# Patient Record
Sex: Female | Born: 1965
Health system: Southern US, Community
[De-identification: ages and names within clinical notes are randomized; demographics above are authoritative.]

## PROBLEM LIST (undated history)

## (undated) DIAGNOSIS — M25562 Pain in left knee: Secondary | ICD-10-CM

## (undated) DIAGNOSIS — G4730 Sleep apnea, unspecified: Secondary | ICD-10-CM

## (undated) DIAGNOSIS — T4145XA Adverse effect of unspecified anesthetic, initial encounter: Secondary | ICD-10-CM

## (undated) DIAGNOSIS — C801 Malignant (primary) neoplasm, unspecified: Secondary | ICD-10-CM

## (undated) DIAGNOSIS — F419 Anxiety disorder, unspecified: Secondary | ICD-10-CM

## (undated) DIAGNOSIS — G5603 Carpal tunnel syndrome, bilateral upper limbs: Secondary | ICD-10-CM

## (undated) HISTORY — PX: ABDOMINAL HYSTERECTOMY: SHX81

## (undated) HISTORY — PX: TONSILLECTOMY: SUR1361

---

## 1986-11-02 HISTORY — PX: TUBAL LIGATION: SHX77

## 1988-11-02 DIAGNOSIS — C801 Malignant (primary) neoplasm, unspecified: Secondary | ICD-10-CM

## 1988-11-02 HISTORY — DX: Malignant (primary) neoplasm, unspecified: C80.1

## 2007-11-03 HISTORY — PX: CHOLECYSTECTOMY: SHX55

## 2008-08-29 ENCOUNTER — Emergency Department (HOSPITAL_COMMUNITY): Admission: EM | Admit: 2008-08-29 | Discharge: 2008-08-29 | Payer: Self-pay | Admitting: Family Medicine

## 2008-11-02 HISTORY — PX: CERVICAL BIOPSY  W/ LOOP ELECTRODE EXCISION: SUR135

## 2008-11-02 HISTORY — PX: FOOT SURGERY: SHX648

## 2009-05-23 ENCOUNTER — Emergency Department (HOSPITAL_COMMUNITY): Admission: EM | Admit: 2009-05-23 | Discharge: 2009-05-23 | Payer: Self-pay | Admitting: Emergency Medicine

## 2009-09-19 ENCOUNTER — Emergency Department (HOSPITAL_COMMUNITY): Admission: EM | Admit: 2009-09-19 | Discharge: 2009-09-19 | Payer: Self-pay | Admitting: Emergency Medicine

## 2009-09-29 ENCOUNTER — Emergency Department (HOSPITAL_BASED_OUTPATIENT_CLINIC_OR_DEPARTMENT_OTHER): Admission: EM | Admit: 2009-09-29 | Discharge: 2009-09-30 | Payer: Self-pay | Admitting: Emergency Medicine

## 2009-09-30 ENCOUNTER — Ambulatory Visit: Payer: Self-pay | Admitting: Diagnostic Radiology

## 2010-02-03 ENCOUNTER — Emergency Department (HOSPITAL_COMMUNITY): Admission: EM | Admit: 2010-02-03 | Discharge: 2010-02-03 | Payer: Self-pay | Admitting: Emergency Medicine

## 2010-06-19 ENCOUNTER — Observation Stay (HOSPITAL_COMMUNITY): Admission: EM | Admit: 2010-06-19 | Discharge: 2010-06-22 | Payer: Self-pay | Admitting: Internal Medicine

## 2010-06-19 ENCOUNTER — Ambulatory Visit: Payer: Self-pay | Admitting: Cardiology

## 2010-06-19 ENCOUNTER — Ambulatory Visit: Payer: Self-pay | Admitting: Diagnostic Radiology

## 2010-06-19 ENCOUNTER — Encounter: Payer: Self-pay | Admitting: Emergency Medicine

## 2010-06-20 ENCOUNTER — Encounter (INDEPENDENT_AMBULATORY_CARE_PROVIDER_SITE_OTHER): Payer: Self-pay | Admitting: Internal Medicine

## 2010-06-20 HISTORY — PX: TRANSTHORACIC ECHOCARDIOGRAM: SHX275

## 2010-07-14 ENCOUNTER — Encounter: Admission: RE | Admit: 2010-07-14 | Discharge: 2010-07-14 | Payer: Self-pay | Admitting: Family Medicine

## 2010-07-23 ENCOUNTER — Other Ambulatory Visit
Admission: RE | Admit: 2010-07-23 | Discharge: 2010-07-23 | Payer: Self-pay | Source: Home / Self Care | Admitting: Obstetrics and Gynecology

## 2010-07-28 ENCOUNTER — Encounter: Admission: RE | Admit: 2010-07-28 | Discharge: 2010-07-28 | Payer: Self-pay | Admitting: Obstetrics and Gynecology

## 2010-08-20 ENCOUNTER — Encounter: Admission: RE | Admit: 2010-08-20 | Discharge: 2010-08-20 | Payer: Self-pay | Admitting: Family Medicine

## 2010-10-16 ENCOUNTER — Ambulatory Visit (HOSPITAL_COMMUNITY)
Admission: RE | Admit: 2010-10-16 | Discharge: 2010-10-17 | Payer: Self-pay | Source: Home / Self Care | Attending: Neurosurgery | Admitting: Neurosurgery

## 2010-10-16 HISTORY — PX: ANTERIOR CERVICAL DECOMP/DISCECTOMY FUSION: SHX1161

## 2010-11-07 ENCOUNTER — Encounter
Admission: RE | Admit: 2010-11-07 | Discharge: 2010-11-07 | Payer: Self-pay | Source: Home / Self Care | Attending: Neurosurgery | Admitting: Neurosurgery

## 2011-01-12 LAB — BASIC METABOLIC PANEL
BUN: 9 mg/dL (ref 6–23)
CO2: 30 mEq/L (ref 19–32)
Calcium: 9.3 mg/dL (ref 8.4–10.5)
Chloride: 105 mEq/L (ref 96–112)
Creatinine, Ser: 0.65 mg/dL (ref 0.4–1.2)
GFR calc Af Amer: >60 mL/min (ref >60)
GFR calc non Af Amer: >60 mL/min (ref >60)
Glucose, Bld: 104 mg/dL — ABNORMAL HIGH (ref 70–99)
Potassium: 4.1 mEq/L (ref 3.5–5.1)
Sodium: 140 mEq/L (ref 135–145)

## 2011-01-12 LAB — CK TOTAL AND CKMB (NOT AT ARMC)
CK, MB: 1 ng/mL (ref 0.3–4.0)
Relative Index: INVALID (ref 0.0–2.5)
Total CK: 53 U/L (ref 7–177)

## 2011-01-13 LAB — SURGICAL PCR SCREEN
MRSA, PCR: NEGATIVE
Staphylococcus aureus: NEGATIVE

## 2011-01-13 LAB — CBC
Hemoglobin: 12.5 g/dL (ref 12.0–15.0)
MCH: 30.3 pg (ref 26.0–34.0)
RBC: 4.13 MIL/uL (ref 3.87–5.11)

## 2011-01-16 LAB — CARDIAC PANEL(CRET KIN+CKTOT+MB+TROPI)
CK, MB: 1 ng/mL (ref 0.3–4.0)
CK, MB: 1.2 ng/mL (ref 0.3–4.0)
CK, MB: 1.2 ng/mL (ref 0.3–4.0)
Relative Index: INVALID (ref 0.0–2.5)
Total CK: 46 U/L (ref 7–177)
Troponin I: 0.01 ng/mL (ref 0.00–0.06)
Troponin I: 0.02 ng/mL (ref 0.00–0.06)
Troponin I: 0.04 ng/mL (ref 0.00–0.06)

## 2011-01-16 LAB — BASIC METABOLIC PANEL
BUN: 11 mg/dL (ref 6–23)
BUN: 12 mg/dL (ref 6–23)
BUN: 9 mg/dL (ref 6–23)
CO2: 29 mEq/L (ref 19–32)
Calcium: 9.3 mg/dL (ref 8.4–10.5)
Calcium: 9.3 mg/dL (ref 8.4–10.5)
Chloride: 107 mEq/L (ref 96–112)
Creatinine, Ser: 0.58 mg/dL (ref 0.4–1.2)
Creatinine, Ser: 0.6 mg/dL (ref 0.4–1.2)
Creatinine, Ser: 0.62 mg/dL (ref 0.4–1.2)
GFR calc Af Amer: >60 mL/min (ref >60)
GFR calc non Af Amer: >60 mL/min (ref >60)
GFR calc non Af Amer: >60 mL/min (ref >60)
Glucose, Bld: 100 mg/dL — ABNORMAL HIGH (ref 70–99)
Potassium: 3.2 mEq/L — ABNORMAL LOW (ref 3.5–5.1)
Sodium: 142 mEq/L (ref 135–145)

## 2011-01-16 LAB — HEPATIC FUNCTION PANEL: Bilirubin, Direct: <0.1 mg/dL (ref 0.0–0.3)

## 2011-01-16 LAB — CBC
Hemoglobin: 12 g/dL (ref 12.0–15.0)
MCH: 29.9 pg (ref 26.0–34.0)
MCH: 30.4 pg (ref 26.0–34.0)
MCH: 31.2 pg (ref 26.0–34.0)
MCHC: 33.7 g/dL (ref 30.0–36.0)
MCV: 90.3 fL (ref 78.0–100.0)
Platelets: 231 10*3/uL (ref 150–400)
Platelets: 267 10*3/uL (ref 150–400)
RBC: 4.02 MIL/uL (ref 3.87–5.11)
RBC: 4.24 MIL/uL (ref 3.87–5.11)
RDW: 12.4 % (ref 11.5–15.5)
RDW: 13.4 % (ref 11.5–15.5)
WBC: 6.6 10*3/uL (ref 4.0–10.5)
WBC: 7.4 10*3/uL (ref 4.0–10.5)

## 2011-01-16 LAB — LIPID PANEL
Cholesterol: 183 mg/dL (ref 0–200)
LDL Cholesterol: 116 mg/dL — ABNORMAL HIGH (ref 0–99)

## 2011-01-16 LAB — D-DIMER, QUANTITATIVE: D-Dimer, Quant: <0.22 ug/mL-FEU (ref 0.00–0.48)

## 2011-01-16 LAB — TSH: TSH: 4.169 u[IU]/mL (ref 0.350–4.500)

## 2011-01-16 LAB — POCT CARDIAC MARKERS: Myoglobin, poc: 36.4 ng/mL (ref 12–200)

## 2011-01-16 LAB — DIFFERENTIAL
Basophils Absolute: 0.2 10*3/uL — ABNORMAL HIGH (ref 0.0–0.1)
Eosinophils Absolute: 0.1 10*3/uL (ref 0.0–0.7)
Lymphocytes Relative: 23 % (ref 12–46)
Lymphs Abs: 1.7 10*3/uL (ref 0.7–4.0)
Neutrophils Relative %: 66 % (ref 43–77)

## 2011-01-21 LAB — DIFFERENTIAL
Basophils Absolute: 0.1 10*3/uL (ref 0.0–0.1)
Basophils Relative: 1 % (ref 0–1)
Eosinophils Absolute: 0.2 10*3/uL (ref 0.0–0.7)
Monocytes Relative: 6 % (ref 3–12)
Neutro Abs: 5.1 10*3/uL (ref 1.7–7.7)
Neutrophils Relative %: 68 % (ref 43–77)

## 2011-01-21 LAB — BASIC METABOLIC PANEL
BUN: 15 mg/dL (ref 6–23)
CO2: 27 mEq/L (ref 19–32)
Calcium: 9.2 mg/dL (ref 8.4–10.5)
Chloride: 106 mEq/L (ref 96–112)
Creatinine, Ser: 0.71 mg/dL (ref 0.4–1.2)
GFR calc Af Amer: >60 mL/min (ref >60)

## 2011-01-21 LAB — CBC
MCHC: 35.5 g/dL (ref 30.0–36.0)
MCV: 88.4 fL (ref 78.0–100.0)
Platelets: 223 10*3/uL (ref 150–400)
RBC: 3.99 MIL/uL (ref 3.87–5.11)
RDW: 13.6 % (ref 11.5–15.5)

## 2011-01-21 LAB — POCT CARDIAC MARKERS: Myoglobin, poc: 49.6 ng/mL (ref 12–200)

## 2011-01-21 LAB — D-DIMER, QUANTITATIVE: D-Dimer, Quant: 0.22 ug/mL-FEU (ref 0.00–0.48)

## 2011-02-04 LAB — CBC
HCT: 36.2 % (ref 36.0–46.0)
HCT: 38.4 % (ref 36.0–46.0)
Hemoglobin: 12.7 g/dL (ref 12.0–15.0)
MCHC: 34.7 g/dL (ref 30.0–36.0)
MCHC: 35.1 g/dL (ref 30.0–36.0)
MCV: 90.1 fL (ref 78.0–100.0)
MCV: 91 fL (ref 78.0–100.0)
Platelets: 220 10*3/uL (ref 150–400)
RBC: 4.02 MIL/uL (ref 3.87–5.11)
WBC: 7.3 10*3/uL (ref 4.0–10.5)

## 2011-02-04 LAB — COMPREHENSIVE METABOLIC PANEL
ALT: 29 U/L (ref 0–35)
Albumin: 3.9 g/dL (ref 3.5–5.2)
BUN: 13 mg/dL (ref 6–23)
BUN: 7 mg/dL (ref 6–23)
CO2: 23 mEq/L (ref 19–32)
CO2: 26 mEq/L (ref 19–32)
Calcium: 8.4 mg/dL (ref 8.4–10.5)
Calcium: 9.2 mg/dL (ref 8.4–10.5)
Chloride: 108 mEq/L (ref 96–112)
Creatinine, Ser: 0.6 mg/dL (ref 0.4–1.2)
Creatinine, Ser: 0.62 mg/dL (ref 0.4–1.2)
GFR calc Af Amer: >60 mL/min (ref >60)
GFR calc non Af Amer: >60 mL/min (ref >60)
GFR calc non Af Amer: >60 mL/min (ref >60)
Glucose, Bld: 88 mg/dL (ref 70–99)
Sodium: 143 mEq/L (ref 135–145)
Total Bilirubin: 0.4 mg/dL (ref 0.3–1.2)

## 2011-02-04 LAB — GC/CHLAMYDIA PROBE AMP, GENITAL
Chlamydia, DNA Probe: NEGATIVE
GC Probe Amp, Genital: NEGATIVE

## 2011-02-04 LAB — POCT URINALYSIS DIP (DEVICE)
Glucose, UA: NEGATIVE mg/dL
Hgb urine dipstick: NEGATIVE
Nitrite: NEGATIVE
Protein, ur: NEGATIVE mg/dL
Specific Gravity, Urine: 1.01 (ref 1.005–1.030)
Urobilinogen, UA: 0.2 mg/dL (ref 0.0–1.0)
pH: 6.5 (ref 5.0–8.0)

## 2011-02-04 LAB — POCT PREGNANCY, URINE: Preg Test, Ur: NEGATIVE

## 2011-02-04 LAB — URINALYSIS, ROUTINE W REFLEX MICROSCOPIC
Nitrite: NEGATIVE
Protein, ur: NEGATIVE mg/dL
Specific Gravity, Urine: 1.028 (ref 1.005–1.030)
Urobilinogen, UA: 0.2 mg/dL (ref 0.0–1.0)

## 2011-02-04 LAB — DIFFERENTIAL
Basophils Absolute: 0 10*3/uL (ref 0.0–0.1)
Eosinophils Absolute: 0 10*3/uL (ref 0.0–0.7)
Lymphocytes Relative: 25 % (ref 12–46)
Lymphs Abs: 0.6 10*3/uL — ABNORMAL LOW (ref 0.7–4.0)
Lymphs Abs: 1.9 10*3/uL (ref 0.7–4.0)
Neutro Abs: 4.5 10*3/uL (ref 1.7–7.7)
Neutro Abs: 5.9 10*3/uL (ref 1.7–7.7)
Neutrophils Relative %: 84 % — ABNORMAL HIGH (ref 43–77)

## 2011-02-04 LAB — WET PREP, GENITAL: Trich, Wet Prep: NONE SEEN

## 2011-02-04 LAB — URINE CULTURE

## 2011-06-13 ENCOUNTER — Encounter: Payer: Self-pay | Admitting: *Deleted

## 2011-06-13 ENCOUNTER — Other Ambulatory Visit: Payer: Self-pay

## 2011-06-13 ENCOUNTER — Emergency Department (HOSPITAL_COMMUNITY)
Admission: EM | Admit: 2011-06-13 | Discharge: 2011-06-13 | Disposition: A | Discharge status: Home / Self Care | Payer: PRIVATE HEALTH INSURANCE | Attending: Emergency Medicine | Admitting: Emergency Medicine

## 2011-06-13 ENCOUNTER — Emergency Department (HOSPITAL_COMMUNITY): Payer: PRIVATE HEALTH INSURANCE

## 2011-06-13 DIAGNOSIS — E669 Obesity, unspecified: Secondary | ICD-10-CM | POA: Insufficient documentation

## 2011-06-13 DIAGNOSIS — R0781 Pleurodynia: Secondary | ICD-10-CM

## 2011-06-13 DIAGNOSIS — R079 Chest pain, unspecified: Secondary | ICD-10-CM | POA: Insufficient documentation

## 2011-06-13 LAB — CBC
HCT: 35 % — ABNORMAL LOW (ref 36.0–46.0)
Hemoglobin: 12.2 g/dL (ref 12.0–15.0)
MCH: 31.1 pg (ref 26.0–34.0)
MCHC: 34.9 g/dL (ref 30.0–36.0)
MCV: 89.3 fL (ref 78.0–100.0)
Platelets: 249 K/uL (ref 150–400)
RBC: 3.92 MIL/uL (ref 3.87–5.11)
RDW: 13.8 % (ref 11.5–15.5)
WBC: 8.3 K/uL (ref 4.0–10.5)

## 2011-06-13 LAB — POCT I-STAT TROPONIN I: Troponin i, poc: 0 ng/mL (ref 0.00–0.08)

## 2011-06-13 LAB — DIFFERENTIAL
Basophils Absolute: 0 K/uL (ref 0.0–0.1)
Basophils Relative: 0 % (ref 0–1)
Eosinophils Absolute: 0.1 10*3/uL (ref 0.0–0.7)
Eosinophils Relative: 2 % (ref 0–5)
Lymphocytes Relative: 25 % (ref 12–46)
Lymphs Abs: 2.1 10*3/uL (ref 0.7–4.0)
Monocytes Absolute: 0.8 10*3/uL (ref 0.1–1.0)
Monocytes Relative: 10 % (ref 3–12)
Neutro Abs: 5.2 K/uL (ref 1.7–7.7)
Neutrophils Relative %: 63 % (ref 43–77)

## 2011-06-13 LAB — CK TOTAL AND CKMB (NOT AT ARMC)
CK, MB: 1.9 ng/mL (ref 0.3–4.0)
Relative Index: INVALID (ref 0.0–2.5)
Total CK: 58 U/L (ref 7–177)

## 2011-06-13 LAB — D-DIMER, QUANTITATIVE: D-Dimer, Quant: 0.39 ug{FEU}/mL (ref 0.00–0.48)

## 2011-06-13 MED ORDER — KETOROLAC TROMETHAMINE 30 MG/ML IJ SOLN
30.0000 mg | Freq: Once | INTRAMUSCULAR | Status: AC
  Administered 2011-06-13: 30 mg via INTRAVENOUS
  Filled 2011-06-13: qty 1

## 2011-06-13 MED ORDER — GI COCKTAIL ~~LOC~~
30.0000 mL | Freq: Once | ORAL | Status: AC
  Administered 2011-06-13: 30 mL via ORAL
  Filled 2011-06-13: qty 30

## 2011-06-13 MED ORDER — PANTOPRAZOLE SODIUM 40 MG IV SOLR
40.0000 mg | Freq: Once | INTRAVENOUS | Status: AC
  Administered 2011-06-13: 40 mg via INTRAVENOUS
  Filled 2011-06-13: qty 40

## 2011-06-13 MED ORDER — FENTANYL CITRATE 0.05 MG/ML IJ SOLN
100.0000 ug | Freq: Once | INTRAMUSCULAR | Status: AC
  Administered 2011-06-13: 100 ug via INTRAVENOUS
  Filled 2011-06-13: qty 2

## 2011-06-13 MED ORDER — IBUPROFEN 600 MG PO TABS: 600.0000 mg | ORAL_TABLET | Freq: Three times a day (TID) | ORAL | Status: AC | PRN

## 2011-06-13 NOTE — ED Notes (Signed)
Pt in no apparent distress; assisted to bedside commode and back to stretcher; reports pain was "a little bit" better prior to getting up to bedside commode; rates pain to left chest under breast 10/10 at present.

## 2011-06-13 NOTE — ED Notes (Signed)
C/o onset of upper back pain, sharp, stabbing, radiating to left chest onset 1930 while walking down the hall at work; states became short of breath with onset of pain and had numbness and tingling down left arm
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
## 2011-06-13 NOTE — ED Notes (Signed)
Placed on cardiac monitor and O2 2L/min per [facility].

## 2011-06-13 NOTE — Discharge Instructions (Signed)

## 2011-06-15 LAB — POCT I-STAT, CHEM 8
BUN: 15 mg/dL (ref 6–23)
Calcium, Ion: 1.11 mmol/L — ABNORMAL LOW (ref 1.12–1.32)
Chloride: 105 mEq/L (ref 96–112)
Creatinine, Ser: 0.8 mg/dL (ref 0.50–1.10)
Glucose, Bld: 107 mg/dL — ABNORMAL HIGH (ref 70–99)
HCT: 35 % — ABNORMAL LOW (ref 36.0–46.0)
Hemoglobin: 11.9 g/dL — ABNORMAL LOW (ref 12.0–15.0)
Potassium: 3.4 mEq/L — ABNORMAL LOW (ref 3.5–5.1)
Sodium: 140 meq/L (ref 135–145)
TCO2: 26 mmol/L (ref 0–100)

## 2012-03-26 ENCOUNTER — Emergency Department (HOSPITAL_COMMUNITY)
Admission: EM | Admit: 2012-03-26 | Discharge: 2012-03-26 | Disposition: A | Discharge status: Home / Self Care | Payer: PRIVATE HEALTH INSURANCE | Attending: Emergency Medicine | Admitting: Emergency Medicine

## 2012-03-26 ENCOUNTER — Emergency Department (HOSPITAL_COMMUNITY): Payer: PRIVATE HEALTH INSURANCE

## 2012-03-26 ENCOUNTER — Encounter (HOSPITAL_COMMUNITY): Payer: Self-pay | Admitting: Emergency Medicine

## 2012-03-26 DIAGNOSIS — M7989 Other specified soft tissue disorders: Secondary | ICD-10-CM | POA: Insufficient documentation

## 2012-03-26 DIAGNOSIS — M25562 Pain in left knee: Secondary | ICD-10-CM

## 2012-03-26 DIAGNOSIS — M25569 Pain in unspecified knee: Secondary | ICD-10-CM | POA: Insufficient documentation

## 2012-03-26 MED ORDER — HYDROCODONE-ACETAMINOPHEN 5-325 MG PO TABS
1.0000 | ORAL_TABLET | Freq: Once | ORAL | Status: AC
  Administered 2012-03-26: 1 via ORAL
  Filled 2012-03-26: qty 1

## 2012-03-26 MED ORDER — DICLOFENAC SODIUM 75 MG PO TBEC: 75.0000 mg | DELAYED_RELEASE_TABLET | Freq: Two times a day (BID) | ORAL | Status: DC

## 2012-03-26 NOTE — ED Notes (Signed)
Called ortho tech to complete order.

## 2012-03-26 NOTE — ED Notes (Signed)
Reports having L knee pain starting last week, got worse today; c/o swelling, denies injury; CMS intact

## 2012-03-26 NOTE — ED Provider Notes (Signed)
History     CSN: [id number]  Arrival date & time 03/26/12  2027   First MD Initiated Contact with Patient 03/26/12 2059      Chief Complaint  Patient presents with  . Knee Pain    (Consider location/radiation/quality/duration/timing/severity/associated sxs/prior treatment) HPI History from patient. 46 year old female who presents with left knee pain. States it started about a week ago and has been progressively worsening since that time. No known injury. Pain is described as aching and worsens with movement. States has been progressively more difficult to bear weight on the knee and she has a sensation as if the knee is clicking, catching, and about to "give out." She has noted some generalized swelling to the knee. When asked the pain is located, she points to her patella. Denies any distal numbness, weakness.  Past Medical History  Diagnosis Date  . Morbid obesity     Past Surgical History  Procedure Date  . Tonsillectomy   . Cholecystectomy   . Cervical fusion   . Cesarean section   . Tubal ligation   . Cervical biopsy  w/ loop electrode excision   . Foot surgery     History reviewed. No pertinent family history.  History  Substance Use Topics  . Smoking status: Never Smoker   . Smokeless tobacco: Not on file  . Alcohol Use: No    OB History    Grav Para Term Preterm Abortions TAB SAB Ect Mult Living                  Review of Systems  Constitutional: Negative for fever, chills, activity change and appetite change.  Musculoskeletal: Positive for arthralgias.  Skin: Negative.     Allergies  Naproxen sodium and Prednisone  Home Medications   Current Outpatient Rx  Name Route Sig Dispense Refill  . ACETAMINOPHEN 500 MG PO TABS Oral Take 2,000 mg by mouth every 4 (four) hours as needed. For pain      BP 131/87  Pulse 81  Temp(Src) 98.5 F (36.9 C) (Oral)  Resp 18  SpO2 99%  LMP 02/25/2012  Physical Exam  Nursing note and vitals  reviewed. Constitutional: She appears well-developed and well-nourished. No distress.  HENT:  Head: Normocephalic and atraumatic.  Neck: Normal range of motion.  Cardiovascular: Normal rate.   Pulmonary/Chest: Effort normal.  Musculoskeletal: Normal range of motion.       Left knee: She exhibits normal range of motion, no swelling, no effusion, no deformity and no bony tenderness.       Tender to palpation in the popliteal fossa and over the patella. No tenderness along medial or lateral joint line. Ligaments grossly intact to drawer/stress. McMurray's negative.  Neurological: She is alert.  Skin: Skin is warm and dry. She is not diaphoretic.  Psychiatric: She has a normal mood and affect.    ED Course  Procedures (including critical care time)  Labs Reviewed - No data to display Dg Knee Complete 4 Views Left  03/26/2012  *RADIOLOGY REPORT*  Clinical Data: Pain and swelling to the left knee.  Unable to bear weight.  Pain for 1 week but worsening today.  LEFT KNEE - COMPLETE 4+ VIEW  Comparison: None.  Findings: Mild degenerative changes in the left knee with medial and lateral compartment narrowing and small osteophytes.  No significant effusion.  No evidence of acute fracture or subluxation.  Bone cortex and trabecular architecture appear intact.  No focal bone lesion or bone destruction.  IMPRESSION: Mild  degenerative changes in the left knee.  No acute bony abnormalities.  Original Report Authenticated By: Marlon Pel, M.D.     1. Left knee pain       MDM  Patient presents with knee pain. No acute findings on exam, although she does have some tenderness both over the patella as well as posteriorly in the popliteal fossa. No gross effusion noted. McMurray's is negative. X-ray, which I personally reviewed and reviewed with the patient, shows some mild degenerative changes. Will treat symptomatically. Placed in a knee sleeve. RICE instructed. Prescription for Voltaren. To follow  up with ortho if not improving. Return precautions discussed.  Grant Fontana, [place] 03/27/12 0001

## 2012-03-26 NOTE — ED Notes (Signed)
I gave the patient a large ice pack for her knee. 

## 2012-03-26 NOTE — Discharge Instructions (Signed)
Your x-ray showed some mild degenerative/arthritis changes but no other worrisome findings. It is possible that you have some inflammation to the knee which is causing your pain. We have put you in a knee sleeve. You can wear this for support as needed. Take the anti-inflammatory medication as prescribed. Apply ice to the knee and prop up the leg on a pillow. If you're not improving in several days, please plan to followup with the orthopedist listed above. Return to the emergency department sooner if you have numbness or weakness of the leg or any other worrisome symptoms.  Knee Pain The knee is the complex joint between your thigh and your lower leg. It is made up of bones, tendons, ligaments, and cartilage. The bones that make up the knee are:  The femur in the thigh.   The tibia and fibula in the lower leg.   The patella or kneecap riding in the groove on the lower femur.  CAUSES  Knee pain is a common complaint with many causes. A few of these causes are:  Injury, such as:   A ruptured ligament or tendon injury.   Torn cartilage.   Medical conditions, such as:   Gout   Arthritis   Infections   Overuse, over training or overdoing a physical activity.  Knee pain can be minor or severe. Knee pain can accompany debilitating injury. Minor knee problems often respond well to self-care measures or get well on their own. More serious injuries may need medical intervention or even surgery. SYMPTOMS The knee is complex. Symptoms of knee problems can vary widely. Some of the problems are:  Pain with movement and weight bearing.   Swelling and tenderness.   Buckling of the knee.   Inability to straighten or extend your knee.   Your knee locks and you cannot straighten it.   Warmth and redness with pain and fever.   Deformity or dislocation of the kneecap.  DIAGNOSIS  Determining what is wrong may be very straight forward such as when there is an injury. It can also be  challenging because of the complexity of the knee. Tests to make a diagnosis may include:  Your caregiver taking a history and doing a physical exam.   Routine X-rays can be used to rule out other problems. X-rays will not reveal a cartilage tear. Some injuries of the knee can be diagnosed by:   Arthroscopy a surgical technique by which a small video camera is inserted through tiny incisions on the sides of the knee. This procedure is used to examine and repair internal knee joint problems. Tiny instruments can be used during arthroscopy to repair the torn knee cartilage (meniscus).   Arthrography is a radiology technique. A contrast liquid is directly injected into the knee joint. Internal structures of the knee joint then become visible on X-ray film.   An MRI scan is a non x-ray radiology procedure in which magnetic fields and a computer produce two- or three-dimensional images of the inside of the knee. Cartilage tears are often visible using an MRI scanner. MRI scans have largely replaced arthrography in diagnosing cartilage tears of the knee.   Blood work.   Examination of the fluid that helps to lubricate the knee joint (synovial fluid). This is done by taking a sample out using a needle and a syringe.  TREATMENT The treatment of knee problems depends on the cause. Some of these treatments are:  Depending on the injury, proper casting, splinting, surgery or physical therapy  care will be needed.   Give yourself adequate recovery time. Do not overuse your joints. If you begin to get sore during workout routines, back off. Slow down or do fewer repetitions.   For repetitive activities such as cycling or running, maintain your strength and nutrition.   Alternate muscle groups. For example if you are a weight lifter, work the upper body on one day and the lower body the next.   Either tight or weak muscles do not give the proper support for your knee. Tight or weak muscles do not absorb  the stress placed on the knee joint. Keep the muscles surrounding the knee strong.   Take care of mechanical problems.   If you have flat feet, orthotics or special shoes may help. See your caregiver if you need help.   Arch supports, sometimes with wedges on the inner or outer aspect of the heel, can help. These can shift pressure away from the side of the knee most bothered by osteoarthritis.   A brace called an "unloader" brace also may be used to help ease the pressure on the most arthritic side of the knee.   If your caregiver has prescribed crutches, braces, wraps or ice, use as directed. The acronym for this is PRICE. This means protection, rest, ice, compression and elevation.   Nonsteroidal anti-inflammatory drugs (NSAID's), can help relieve pain. But if taken immediately after an injury, they may actually increase swelling. Take NSAID's with food in your stomach. Stop them if you develop stomach problems. Do not take these if you have a history of ulcers, stomach pain or bleeding from the bowel. Do not take without your caregiver's approval if you have problems with fluid retention, heart failure, or kidney problems.   For ongoing knee problems, physical therapy may be helpful.   Glucosamine and chondroitin are over-the-counter dietary supplements. Both may help relieve the pain of osteoarthritis in the knee. These medicines are different from the usual anti-inflammatory drugs. Glucosamine may decrease the rate of cartilage destruction.   Injections of a corticosteroid drug into your knee joint may help reduce the symptoms of an arthritis flare-up. They may provide pain relief that lasts a few months. You may have to wait a few months between injections. The injections do have a small increased risk of infection, water retention and elevated blood sugar levels.   Hyaluronic acid injected into damaged joints may ease pain and provide lubrication. These injections may work by reducing  inflammation. A series of shots may give relief for as long as 6 months.   Topical painkillers. Applying certain ointments to your skin may help relieve the pain and stiffness of osteoarthritis. Ask your pharmacist for suggestions. Many over the-counter products are approved for temporary relief of arthritis pain.   In some countries, doctors often prescribe topical NSAID's for relief of chronic conditions such as arthritis and tendinitis. A review of treatment with NSAID creams found that they worked as well as oral medications but without the serious side effects.  PREVENTION  Maintain a healthy weight. Extra pounds put more strain on your joints.   Get strong, stay limber. Weak muscles are a common cause of knee injuries. Stretching is important. Include flexibility exercises in your workouts.   Be smart about exercise. If you have osteoarthritis, chronic knee pain or recurring injuries, you may need to change the way you exercise. This does not mean you have to stop being active. If your knees ache after jogging or playing basketball,  consider switching to swimming, water aerobics or other low-impact activities, at least for a few days a week. Sometimes limiting high-impact activities will provide relief.   Make sure your shoes fit well. Choose footwear that is right for your sport.   Protect your knees. Use the proper gear for knee-sensitive activities. Use kneepads when playing volleyball or laying carpet. Buckle your seat belt every time you drive. Most shattered kneecaps occur in car accidents.   Rest when you are tired.  SEEK MEDICAL CARE IF:  You have knee pain that is continual and does not seem to be getting better.  SEEK IMMEDIATE MEDICAL CARE IF:  Your knee joint feels hot to the touch and you have a high fever. MAKE SURE YOU:   Understand these instructions.   Will watch your condition.   Will get help right away if you are not doing well or get worse.  Document Released:  08/16/2007 Document Revised: 10/08/2011 Document Reviewed: 08/16/2007 [employer] [facility] Patient Information 2012 [place], [place].

## 2012-03-27 NOTE — ED Provider Notes (Signed)
Medical screening examination/treatment/procedure(s) were performed by non-physician practitioner and as supervising physician I was immediately available for consultation/collaboration.    Nelia Shi, MD 03/27/12 1025

## 2012-06-20 ENCOUNTER — Other Ambulatory Visit (HOSPITAL_COMMUNITY): Payer: Self-pay | Admitting: Orthopedic Surgery

## 2012-06-20 DIAGNOSIS — M25562 Pain in left knee: Secondary | ICD-10-CM

## 2012-06-22 ENCOUNTER — Ambulatory Visit (HOSPITAL_COMMUNITY)
Admission: RE | Admit: 2012-06-22 | Discharge: 2012-06-22 | Disposition: A | Discharge status: Home / Self Care | Payer: PRIVATE HEALTH INSURANCE | Source: Ambulatory Visit | Attending: Orthopedic Surgery | Admitting: Orthopedic Surgery

## 2012-06-22 DIAGNOSIS — M25569 Pain in unspecified knee: Secondary | ICD-10-CM | POA: Insufficient documentation

## 2012-06-22 DIAGNOSIS — M25562 Pain in left knee: Secondary | ICD-10-CM

## 2012-06-22 DIAGNOSIS — M25469 Effusion, unspecified knee: Secondary | ICD-10-CM | POA: Insufficient documentation

## 2012-08-28 ENCOUNTER — Encounter (HOSPITAL_COMMUNITY): Payer: Self-pay

## 2012-08-28 ENCOUNTER — Emergency Department (HOSPITAL_COMMUNITY)
Admission: EM | Admit: 2012-08-28 | Discharge: 2012-08-28 | Disposition: A | Discharge status: Home / Self Care | Payer: PRIVATE HEALTH INSURANCE | Attending: Emergency Medicine | Admitting: Emergency Medicine

## 2012-08-28 ENCOUNTER — Emergency Department (HOSPITAL_COMMUNITY): Payer: PRIVATE HEALTH INSURANCE

## 2012-08-28 DIAGNOSIS — M25562 Pain in left knee: Secondary | ICD-10-CM

## 2012-08-28 DIAGNOSIS — M25569 Pain in unspecified knee: Secondary | ICD-10-CM | POA: Insufficient documentation

## 2012-08-28 DIAGNOSIS — G8929 Other chronic pain: Secondary | ICD-10-CM | POA: Insufficient documentation

## 2012-08-28 MED ORDER — IBUPROFEN 800 MG PO TABS
800.0000 mg | ORAL_TABLET | Freq: Once | ORAL | Status: AC
  Administered 2012-08-28: 800 mg via ORAL
  Filled 2012-08-28: qty 1

## 2012-08-28 MED ORDER — IBUPROFEN 800 MG PO TABS: 800.0000 mg | ORAL_TABLET | Freq: Three times a day (TID) | ORAL | Status: DC

## 2012-08-28 NOTE — ED Provider Notes (Signed)
Medical screening examination/treatment/procedure(s) were performed by non-physician practitioner and as supervising physician I was immediately available for consultation/collaboration.  John-Adam Shyhiem Beeney, M.D.     John-Adam Karington Zarazua, MD 08/28/12 1243 

## 2012-08-28 NOTE — ED Notes (Signed)
C/o left knee pain that is chronic pt per. Recently had MRI done through [facility]. States, " I was walking down hallway today and work and my knee gave out. I can't put any weight on it" pt denies falling.

## 2012-08-28 NOTE — ED Provider Notes (Signed)
History     CSN: [id number]  Arrival date & time 08/28/12  4098   First MD Initiated Contact with Patient 08/28/12 [phone]      Chief Complaint  Patient presents with  . 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  She is seeing dr. Charlann Boxer and getting a series of intra-articular injections.  "my knee has not been bothering me at all for months".  She was walking down a hallway at work and her L knee "gave way".  She nearly fell but grabbed onto a rail and caught herself.  States if she just tries to put any weight on the leg it "won't hold me".  Heard/felt a "pop".  No other injury or complaints.  Patient is a 46 y.o. female presenting with knee pain. The history is provided by the patient. No language interpreter was used.  Knee Pain This is a chronic problem. The problem occurs constantly. The problem has been unchanged. Pertinent negatives include no chills or fever. The symptoms are aggravated by walking and standing. She has tried nothing for the symptoms.    Past Medical History  Diagnosis Date  . Morbid obesity     Past Surgical History  Procedure Date  . Tonsillectomy   . Cholecystectomy   . Cervical fusion   . Cesarean section   . Tubal ligation   . Cervical biopsy  w/ loop electrode excision   . Foot surgery     No family history on file.  History  Substance Use Topics  . Smoking status: Never Smoker   . Smokeless tobacco: Not on file  . Alcohol Use: No    OB History    Grav Para Term Preterm Abortions TAB SAB Ect Mult Living                  Review of Systems  Constitutional: Negative for fever and chills.  Musculoskeletal:       Knee pain   All other systems reviewed and are negative.    Allergies  Naproxen sodium and Prednisone  Home Medications   Current Outpatient Rx  Name Route Sig Dispense Refill  . ACETAMINOPHEN 500 MG PO  TABS Oral Take 2,000 mg by mouth every 4 (four) hours as needed. For pain    . DICLOFENAC SODIUM 75 MG PO TBEC Oral Take 1 tablet (75 mg total) by mouth 2 (two) times daily. 20 tablet 0    BP 145/87  Pulse 100  Temp 97.9 F (36.6 C) (Oral)  Resp 18  SpO2 100%  LMP 08/16/2012  Physical Exam  Nursing note and vitals reviewed. Constitutional: She is oriented to person, place, and time. She appears well-developed and well-nourished. No distress.  HENT:  Head: Normocephalic and atraumatic.  Eyes: EOM are normal.  Neck: Normal range of motion.  Cardiovascular: Normal rate and regular rhythm.   Pulmonary/Chest: Effort normal.  Abdominal: Soft. She exhibits no distension. There is no tenderness.  Musculoskeletal: She exhibits tenderness.       Left knee: She exhibits decreased range of motion. She exhibits no effusion, no ecchymosis, no deformity, no laceration, no erythema and no LCL laxity. tenderness found. Medial joint line tenderness noted.       Legs: Neurological: She is alert and oriented to person, place, and time.  Skin: Skin is warm and dry.  Psychiatric: She has a normal mood and affect. Judgment normal.    ED Course  Procedures (including critical care time)  Labs Reviewed - No data to display No results found.   No diagnosis found.    MDM  Pt states she has a knee immobilizer and a walker available to her.   Ice rx-ibuprofen 8800 mg TID Will call dr. Charlann Boxer tomorrow for an appt ASAP.        Evalina Field, PA 08/28/12 1001

## 2012-08-28 NOTE — ED Notes (Addendum)
Pt called back wanting something stronger for pain. rx for hydrocodone 5/325mg  one po q4-6 hours prn pain disp 20 per Neville Route PA. Called to [facility] in [place].

## 2012-10-17 ENCOUNTER — Encounter (HOSPITAL_BASED_OUTPATIENT_CLINIC_OR_DEPARTMENT_OTHER): Payer: Self-pay | Admitting: *Deleted

## 2012-10-17 NOTE — Progress Notes (Signed)
NPO AFTER MN WITH EXCEPTION WATER/ GATORADE UNTIL 0700. ARRIVES AT 1115. NEEDS HG .

## 2012-10-20 ENCOUNTER — Encounter (HOSPITAL_BASED_OUTPATIENT_CLINIC_OR_DEPARTMENT_OTHER)
Admission: RE | Disposition: A | Discharge status: Home / Self Care | Payer: Self-pay | Source: Ambulatory Visit | Attending: Orthopedic Surgery

## 2012-10-20 ENCOUNTER — Encounter (HOSPITAL_BASED_OUTPATIENT_CLINIC_OR_DEPARTMENT_OTHER): Payer: Self-pay | Admitting: *Deleted

## 2012-10-20 ENCOUNTER — Ambulatory Visit (HOSPITAL_BASED_OUTPATIENT_CLINIC_OR_DEPARTMENT_OTHER): Payer: PRIVATE HEALTH INSURANCE | Admitting: Anesthesiology

## 2012-10-20 ENCOUNTER — Ambulatory Visit (HOSPITAL_BASED_OUTPATIENT_CLINIC_OR_DEPARTMENT_OTHER)
Admission: RE | Admit: 2012-10-20 | Discharge: 2012-10-20 | Disposition: A | Discharge status: Home / Self Care | Payer: PRIVATE HEALTH INSURANCE | Source: Ambulatory Visit | Attending: Orthopedic Surgery | Admitting: Orthopedic Surgery

## 2012-10-20 ENCOUNTER — Encounter (HOSPITAL_BASED_OUTPATIENT_CLINIC_OR_DEPARTMENT_OTHER): Payer: Self-pay | Admitting: Anesthesiology

## 2012-10-20 DIAGNOSIS — M2240 Chondromalacia patellae, unspecified knee: Secondary | ICD-10-CM | POA: Insufficient documentation

## 2012-10-20 DIAGNOSIS — E669 Obesity, unspecified: Secondary | ICD-10-CM | POA: Insufficient documentation

## 2012-10-20 DIAGNOSIS — M659 Unspecified synovitis and tenosynovitis, unspecified site: Secondary | ICD-10-CM | POA: Insufficient documentation

## 2012-10-20 DIAGNOSIS — Z981 Arthrodesis status: Secondary | ICD-10-CM | POA: Insufficient documentation

## 2012-10-20 DIAGNOSIS — Z9889 Other specified postprocedural states: Secondary | ICD-10-CM

## 2012-10-20 DIAGNOSIS — IMO0002 Reserved for concepts with insufficient information to code with codable children: Secondary | ICD-10-CM | POA: Insufficient documentation

## 2012-10-20 DIAGNOSIS — X58XXXA Exposure to other specified factors, initial encounter: Secondary | ICD-10-CM | POA: Insufficient documentation

## 2012-10-20 HISTORY — DX: Carpal tunnel syndrome, bilateral upper limbs: G56.03

## 2012-10-20 HISTORY — DX: Adverse effect of unspecified anesthetic, initial encounter: T41.45XA

## 2012-10-20 HISTORY — PX: KNEE ARTHROSCOPY: SHX127

## 2012-10-20 HISTORY — DX: Pain in left knee: M25.562

## 2012-10-20 LAB — POCT HEMOGLOBIN-HEMACUE: Hemoglobin: 12.4 g/dL (ref 12.0–15.0)

## 2012-10-20 SURGERY — ARTHROSCOPY, KNEE
Anesthesia: General | Site: Knee | Laterality: Left | Wound class: Clean

## 2012-10-20 MED ORDER — HYDROCODONE-ACETAMINOPHEN 5-325 MG PO TABS: 1.0000 | ORAL_TABLET | Freq: Four times a day (QID) | ORAL | Status: DC | PRN

## 2012-10-20 MED ORDER — FENTANYL CITRATE 0.05 MG/ML IJ SOLN
INTRAMUSCULAR | Status: DC | PRN
  Administered 2012-10-20 (×2): 50 ug via INTRAVENOUS
  Administered 2012-10-20: 100 ug via INTRAVENOUS

## 2012-10-20 MED ORDER — PROPOFOL 10 MG/ML IV BOLUS
INTRAVENOUS | Status: DC | PRN
  Administered 2012-10-20: 250 mg via INTRAVENOUS

## 2012-10-20 MED ORDER — PROMETHAZINE HCL 25 MG/ML IJ SOLN
6.2500 mg | INTRAMUSCULAR | Status: DC | PRN
  Filled 2012-10-20: qty 1

## 2012-10-20 MED ORDER — MIDAZOLAM HCL 5 MG/5ML IJ SOLN
INTRAMUSCULAR | Status: DC | PRN
  Administered 2012-10-20: 2 mg via INTRAVENOUS

## 2012-10-20 MED ORDER — KETOROLAC TROMETHAMINE 30 MG/ML IJ SOLN
15.0000 mg | Freq: Once | INTRAMUSCULAR | Status: DC | PRN
  Filled 2012-10-20: qty 1

## 2012-10-20 MED ORDER — BUPIVACAINE-EPINEPHRINE 0.25% -1:200000 IJ SOLN
INTRAMUSCULAR | Status: DC | PRN
  Administered 2012-10-20: 30 mL

## 2012-10-20 MED ORDER — ASPIRIN EC 325 MG PO TBEC: 325.0000 mg | DELAYED_RELEASE_TABLET | Freq: Every day | ORAL | Status: DC

## 2012-10-20 MED ORDER — CEFAZOLIN SODIUM-DEXTROSE 2-3 GM-% IV SOLR
INTRAVENOUS | Status: DC | PRN
  Administered 2012-10-20: 2 g via INTRAVENOUS

## 2012-10-20 MED ORDER — SODIUM CHLORIDE 0.9 % IR SOLN
Status: DC | PRN
  Administered 2012-10-20: 9000 mL

## 2012-10-20 MED ORDER — LACTATED RINGERS IV SOLN
INTRAVENOUS | Status: DC
  Administered 2012-10-20: 12:00:00 via INTRAVENOUS
  Filled 2012-10-20: qty 1000

## 2012-10-20 MED ORDER — MELOXICAM 7.5 MG PO TABS: 7.5000 mg | ORAL_TABLET | Freq: Two times a day (BID) | ORAL | Status: DC

## 2012-10-20 MED ORDER — LIDOCAINE-EPINEPHRINE (PF) 1 %-1:200000 IJ SOLN
INTRAMUSCULAR | Status: DC | PRN
  Administered 2012-10-20: 15 mL

## 2012-10-20 MED ORDER — LACTATED RINGERS IV SOLN
INTRAVENOUS | Status: DC | PRN
  Administered 2012-10-20 (×2): via INTRAVENOUS

## 2012-10-20 MED ORDER — METHOCARBAMOL 500 MG PO TABS: 500.0000 mg | ORAL_TABLET | Freq: Four times a day (QID) | ORAL | Status: DC | PRN

## 2012-10-20 MED ORDER — CEFAZOLIN SODIUM-DEXTROSE 2-3 GM-% IV SOLR
2.0000 g | INTRAVENOUS | Status: DC
  Filled 2012-10-20: qty 50

## 2012-10-20 MED ORDER — LIDOCAINE HCL (CARDIAC) 20 MG/ML IV SOLN
INTRAVENOUS | Status: DC | PRN
  Administered 2012-10-20: 100 mg via INTRAVENOUS

## 2012-10-20 MED ORDER — ONDANSETRON HCL 4 MG/2ML IJ SOLN
INTRAMUSCULAR | Status: DC | PRN
  Administered 2012-10-20: 4 mg via INTRAVENOUS

## 2012-10-20 MED ORDER — FENTANYL CITRATE 0.05 MG/ML IJ SOLN
25.0000 ug | INTRAMUSCULAR | Status: DC | PRN
  Filled 2012-10-20: qty 1

## 2012-10-20 SURGICAL SUPPLY — 28 items
BANDAGE ELASTIC 6 VELCRO ST LF (GAUZE/BANDAGES/DRESSINGS) ×2 IMPLANT
BLADE 4.2CUDA (BLADE) IMPLANT
BLADE CUDA SHAVER 3.5 (BLADE) ×2 IMPLANT
CANISTER SUCT LVC 12 LTR MEDI- (MISCELLANEOUS) ×2 IMPLANT
CANISTER SUCTION 2500CC (MISCELLANEOUS) ×2 IMPLANT
CLOTH BEACON ORANGE TIMEOUT ST (SAFETY) ×2 IMPLANT
DRAPE ARTHROSCOPY W/POUCH 114 (DRAPES) ×2 IMPLANT
DRSG EMULSION OIL 3X3 NADH (GAUZE/BANDAGES/DRESSINGS) ×2 IMPLANT
DRSG PAD ABDOMINAL 8X10 ST (GAUZE/BANDAGES/DRESSINGS) ×2 IMPLANT
DURAPREP 26ML APPLICATOR (WOUND CARE) ×2 IMPLANT
ELECT MENISCUS 165MM 90D (ELECTRODE) IMPLANT
ELECT REM PT RETURN 9FT ADLT (ELECTROSURGICAL)
ELECTRODE REM PT RTRN 9FT ADLT (ELECTROSURGICAL) IMPLANT
GLOVE BIO SURGEON STRL SZ 6.5 (GLOVE) ×2 IMPLANT
GLOVE BIO SURGEON STRL SZ7.5 (GLOVE) ×2 IMPLANT
GLOVE ECLIPSE 6.0 STRL STRAW (GLOVE) ×2 IMPLANT
GOWN PREVENTION PLUS LG XLONG (DISPOSABLE) ×2 IMPLANT
KNEE WRAP E Z 3 GEL PACK (MISCELLANEOUS) ×2 IMPLANT
PACK ARTHROSCOPY DSU (CUSTOM PROCEDURE TRAY) ×2 IMPLANT
PACK BASIN DAY SURGERY FS (CUSTOM PROCEDURE TRAY) ×2 IMPLANT
PENCIL BUTTON HOLSTER BLD 10FT (ELECTRODE) IMPLANT
SET ARTHROSCOPY TUBING (MISCELLANEOUS) ×1
SET ARTHROSCOPY TUBING LN (MISCELLANEOUS) ×1 IMPLANT
SPONGE GAUZE 4X4 12PLY (GAUZE/BANDAGES/DRESSINGS) ×2 IMPLANT
SUT ETHILON 4 0 PS 2 18 (SUTURE) ×2 IMPLANT
SYR CONTROL 10ML LL (SYRINGE) ×2 IMPLANT
TOWEL OR 17X24 6PK STRL BLUE (TOWEL DISPOSABLE) ×2 IMPLANT
WATER STERILE IRR 500ML POUR (IV SOLUTION) ×2 IMPLANT

## 2012-10-20 NOTE — H&P (Signed)
  CC- [name] is a 46 y.o. female who presents with left knee pain.  HPI- . Knee Pain: Patient presents with a knee injury involving the  left knee. Onset of the symptoms was several months ago. Inciting event: injured during a fall while at work. Current symptoms include pain located medially, popping sensation and swelling. Pain is aggravated by lateral movements, pivoting and walking.  Patient has had no prior knee problems. Evaluation to date: plain films: normal and MRI: mild OA medial and patellofemoral with questionable medial meniscal tear. Treatment to date: avoidance of offending activity, brace which is somewhat effective, corticosteroid injection which was not very effective and prescription NSAIDS which are not very effective.  Past Medical History  Diagnosis Date  . Left knee pain   . Carpal tunnel syndrome, bilateral   . Complication of anesthesia INTRAOP HYPERTENSION W/ TONSILLECTOMY SURGERY AGE 76--  NO ISSUES SINCE    Past Surgical History  Procedure Date  . Tonsillectomy AGE 76  . Cholecystectomy 2009  . Anterior cervical decomp/discectomy fusion 10-16-2010    C4  -  C6  . Cesarean section 1987  . Tubal ligation 1988  . Cervical biopsy  w/ loop electrode excision 2010  . Foot surgery 2010    LEFT  . Transthoracic echocardiogram 06-20-2010    NORMAL LVSF/ EF 55%    Prior to Admission medications   Medication Sig Start Date End Date Taking? Authorizing Provider  ibuprofen (ADVIL,MOTRIN) 800 MG tablet Take 1 tablet (800 mg total) by mouth 3 (three) times daily. 08/28/12  Yes Evalina Field, PA    antalgic gait, soft tissue tenderness over medial joint line more so than laterally, exam limited by acuity of pain  Physical Examination: General appearance - alert, well appearing, and in no distress and overweight Chest - clear to auscultation, no wheezes, rales or rhonchi, symmetric air entry Heart - normal rate, regular rhythm, normal S1, S2, no murmurs, rubs,  clicks or gallops, normal rate and regular rhythm Abdomen - soft, nontender, nondistended, no masses or organomegaly Musculoskeletal - no joint tenderness, deformity or swelling, abnormal exam of left as noted above, RIGHT knee normal Extremities - peripheral pulses normal, no pedal edema, no clubbing or cyanosis Skin - normal coloration and turgor, no rashes, no suspicious skin lesions noted   Asessment/Plan--- Left knee medial meniscal tear- - Plan left knee diagnostic and operative arthroscopy with meniscal debridement, chondral debridement, synovectomy as needed. Procedure risks and potential comps discussed with patient who elects to proceed. Goals are decreased pain and increased function.

## 2012-10-20 NOTE — Anesthesia Procedure Notes (Signed)
Procedure Name: LMA Insertion Date/Time: 10/20/2012 2:09 PM Performed by: Jessica Priest Pre-anesthesia Checklist: Patient identified, Emergency Drugs available, Suction available and Patient being monitored Patient Re-evaluated:Patient Re-evaluated prior to inductionOxygen Delivery Method: Circle System Utilized Preoxygenation: Pre-oxygenation with 100% oxygen Intubation Type: IV induction Ventilation: Mask ventilation without difficulty LMA: LMA inserted LMA Size: 4.0 Number of attempts: 1 Airway Equipment and Method: bite block Placement Confirmation: positive ETCO2 Tube secured with: Tape Dental Injury: Teeth and Oropharynx as per pre-operative assessment

## 2012-10-20 NOTE — Anesthesia Postprocedure Evaluation (Signed)
Anesthesia Post Note  Patient: [name]  Procedure(s) Performed: Procedure(s) (LRB): ARTHROSCOPY KNEE (Left)  Anesthesia type: General  Patient location: PACU  Post pain: Pain level controlled  Post assessment: Post-op Vital signs reviewed  Last Vitals: BP 130/87  Pulse 94  Temp 36.1 C (Oral)  Resp 18  Ht 5' (1.524 m)  Wt 228 lb (103.42 kg)  BMI 44.53 kg/m2  SpO2 98%  LMP 10/13/2012  Post vital signs: Reviewed  Level of consciousness: sedated  Complications: No apparent anesthesia complications

## 2012-10-20 NOTE — Transfer of Care (Signed)
Immediate Anesthesia Transfer of Care Note  Patient: [name]  Procedure(s) Performed: Procedure(s) (LRB): ARTHROSCOPY KNEE (Left)  Patient Location: PACU  Anesthesia Type: General  Level of Consciousness: awake, sedated, patient cooperative and responds to stimulation  Airway & Oxygen Therapy: Patient Spontanous Breathing and Patient connected to face mask oxygen  Post-op Assessment: Report given to PACU RN, Post -op Vital signs reviewed and stable and Patient moving all extremities  Post vital signs: Reviewed and stable  Complications: No apparent anesthesia complications

## 2012-10-20 NOTE — Anesthesia Preprocedure Evaluation (Signed)
Anesthesia Evaluation  Patient identified by MRN, date of birth, ID band Patient awake    Reviewed: Allergy & Precautions, H&P , NPO status , Patient's Chart, lab work & pertinent test results  Airway Mallampati: III TM Distance: <3 FB Neck ROM: Full    Dental No notable dental hx.    Pulmonary neg pulmonary ROS,  breath sounds clear to auscultation  + decreased breath sounds      Cardiovascular negative cardio ROS  Rhythm:Regular Rate:Normal     Neuro/Psych negative neurological ROS  negative psych ROS   GI/Hepatic negative GI ROS, Neg liver ROS,   Endo/Other  Morbid obesity  Renal/GU negative Renal ROS  negative genitourinary   Musculoskeletal negative musculoskeletal ROS (+)   Abdominal (+) + obese,   Peds negative pediatric ROS (+)  Hematology negative hematology ROS (+)   Anesthesia Other Findings   Reproductive/Obstetrics negative OB ROS                           Anesthesia Physical Anesthesia Plan  ASA: II  Anesthesia Plan: General   Post-op Pain Management:    Induction: Intravenous  Airway Management Planned: LMA  Additional Equipment:   Intra-op Plan:   Post-operative Plan:   Informed Consent: I have reviewed the patients History and Physical, chart, labs and discussed the procedure including the risks, benefits and alternatives for the proposed anesthesia with the patient or authorized representative who has indicated his/her understanding and acceptance.   Dental advisory given  Plan Discussed with: CRNA and Surgeon  Anesthesia Plan Comments:         Anesthesia Quick Evaluation

## 2012-10-21 ENCOUNTER — Encounter (HOSPITAL_BASED_OUTPATIENT_CLINIC_OR_DEPARTMENT_OTHER): Payer: Self-pay | Admitting: Orthopedic Surgery

## 2012-10-27 NOTE — Brief Op Note (Signed)
10/20/2012  1:31 PM  PATIENT:  [name]  46 y.o. female  PRE-OPERATIVE DIAGNOSIS:  LEFT KNEE QUESTIONABLE MEDIAL MENISCUS TEAR/PAIN  POST-OPERATIVE DIAGNOSIS:  1.  LEFT KNEE MEDIAL MENISCUS TEAR, 2. Chondromalacia medial and patellofemoral  PROCEDURE:  Procedure(s) (LRB) with comments: ARTHROSCOPY KNEE (Left) - 1.  Partial medial menisectomy, 2.  Medial and patellar chondroplasty and 3.  synovectomy  SURGEON:  Surgeon(s) and Role:    * Shelda Pal, MD - Primary  PHYSICIAN ASSISTANT: None  ANESTHESIA:   general  EBL:   Minimal  BLOOD ADMINISTERED:none  DRAINS: none   LOCAL MEDICATIONS USED:  MARCAINE     SPECIMEN:  No Specimen  DISPOSITION OF SPECIMEN:  N/A  COUNTS:  YES  TOURNIQUET:  * No tourniquets in log *  DICTATION: .Other Dictation: Dictation Number [id number]  PLAN OF CARE: Discharge to home after PACU  PATIENT DISPOSITION:  PACU - hemodynamically stable.   Delay start of Pharmacological VTE agent (>24hrs) due to surgical blood loss or risk of bleeding: not applicable

## 2012-10-28 NOTE — Op Note (Signed)
[name], [name]                ACCOUNT NO.:  [id number]  MEDICAL RECORD NO.:  [id number]  LOCATION:                                 FACILITY:  PHYSICIAN:  Madlyn Frankel. Charlann Boxer, M.D.  DATE OF BIRTH:  November 21, 1965  DATE OF PROCEDURE:  10/20/2012 DATE OF DISCHARGE:                              OPERATIVE REPORT   PREOPERATIVE DIAGNOSIS:  Left knee medial meniscal tear, associated with chondromalacia.  POSTOPERATIVE DIAGNOSES/FINDINGS: 1. Medial meniscal tear, posterior horn, mid body. 2. Grade 2-3 chondromalacia in the medial and patellofemoral     compartments. 3. Anterior medial synovitis. 4. She was noted to have finding significant chondral debris floating     in the joint, captured on the picture.  PROCEDURE:  Left knee diagnostic and operative arthroscopy with: 1. Partial medial meniscectomy. 2. Medial patellofemoral chondroplasty with debridement chondral     flaps. 3. Synovectomy over the anterior medial aspect of the knee to prevent     further impingement in the anterior medial aspect of the joint.  SURGEON:  Madlyn Frankel. Charlann Boxer, MD  ASSISTANT:  Surgical team.  ANESTHESIA:  General.  SPECIMENS:  None.  COMPLICATIONS:  None.  DRAINS:  None.  INDICATIONS FOR PROCEDURE:  [name] is a 46 year old female with ongoing and recurrent left knee pain, failing conservative measures of injections and medications.  MRI revealed concern for meniscal pathology.  Given the persistence and recurrence or problems, she wished at this point to proceed with more definitive options, risks of recurrence of pathology, progressive arthritis, and standard risk of infection, DVT, were all reviewed.  Consent was obtained for benefit of pain relief.  PROCEDURE IN DETAIL:  The patient was brought to the operative theater. Once adequate anesthesia, preoperative antibiotics, Ancef administered, she was positioned supine, left leg in a leg holder.  Left lower extremity was then prepped and  draped in sterile fashion.  Standard inferomedial, superomedial, and inferolateral portals were utilized. Diagnostic evaluation of the knee revealed the above findings.  Inferomedial portal was utilized as a sole working portal here.  The part of examination identifying meniscal pathology was performed followed by the use of straight biting basket and a 3.5 Cuda shaver to debride the meniscus back to a stable peripheral layer.  Also noted in the distal and posterior aspect of the femoral condyle was chondral flaps, probably related to the meniscal pathology in one way or another.  These were debrided back to a stable level.  Using the 3.5 Cuda shaver, there was no eburnated bone, unroofed.  Laterally, the anterior cruciate ligament was noted to be intact.  The lateral compartment was relatively pristine.  Anteriorly, a patellofemoral chondroplasty debrided again, unstable flaps of cartilage was carried out, but no eburnated bone exposed.  Anterior medial synovectomy was carried out from the medial gutter around the anterior aspect of the knee preventing further anterior medial impingement given the medial symptoms she had.  Once all the procedures were performed, the instrumentation was removed from the knee.  Portal sites were reapproximated using 4-0 nylon.  I injected at the end of the case 0.25% Marcaine with epinephrine.  She tolerated this well.  Her knee was  wrapped into a sterile bulky Jones dressing.  She was awakened from anesthesia, and taken to the recovery room in stable condition.  She will be seen back in the office for routine followup 10-12 days. She can be weightbearing as tolerated, begin exercises at home when she feels up to.     Madlyn Frankel Charlann Boxer, M.D.     MDO/MEDQ  D:  10/27/2012  T:  10/28/2012  Job:  [id number]

## 2012-11-08 ENCOUNTER — Ambulatory Visit: Payer: PRIVATE HEALTH INSURANCE | Attending: Orthopedic Surgery | Admitting: Physical Therapy

## 2012-11-08 DIAGNOSIS — M25669 Stiffness of unspecified knee, not elsewhere classified: Secondary | ICD-10-CM | POA: Insufficient documentation

## 2012-11-08 DIAGNOSIS — R609 Edema, unspecified: Secondary | ICD-10-CM | POA: Insufficient documentation

## 2012-11-08 DIAGNOSIS — M25569 Pain in unspecified knee: Secondary | ICD-10-CM | POA: Insufficient documentation

## 2012-11-08 DIAGNOSIS — R269 Unspecified abnormalities of gait and mobility: Secondary | ICD-10-CM | POA: Insufficient documentation

## 2012-11-08 DIAGNOSIS — IMO0001 Reserved for inherently not codable concepts without codable children: Secondary | ICD-10-CM | POA: Insufficient documentation

## 2012-11-08 DIAGNOSIS — M6281 Muscle weakness (generalized): Secondary | ICD-10-CM | POA: Insufficient documentation

## 2012-11-10 ENCOUNTER — Ambulatory Visit: Payer: PRIVATE HEALTH INSURANCE | Admitting: Physical Therapy

## 2012-11-14 ENCOUNTER — Ambulatory Visit: Payer: PRIVATE HEALTH INSURANCE | Admitting: Physical Therapy

## 2012-11-16 ENCOUNTER — Ambulatory Visit: Payer: PRIVATE HEALTH INSURANCE | Admitting: Physical Therapy

## 2012-11-18 ENCOUNTER — Encounter: Payer: Self-pay | Admitting: Physical Therapy

## 2012-11-21 ENCOUNTER — Ambulatory Visit: Payer: PRIVATE HEALTH INSURANCE | Admitting: Physical Therapy

## 2012-11-23 ENCOUNTER — Ambulatory Visit: Payer: PRIVATE HEALTH INSURANCE | Admitting: Physical Therapy

## 2012-11-25 ENCOUNTER — Ambulatory Visit: Payer: PRIVATE HEALTH INSURANCE | Admitting: Physical Therapy

## 2012-11-28 ENCOUNTER — Ambulatory Visit: Payer: PRIVATE HEALTH INSURANCE | Admitting: Physical Therapy

## 2012-11-30 ENCOUNTER — Ambulatory Visit: Payer: PRIVATE HEALTH INSURANCE | Admitting: Physical Therapy

## 2012-12-02 ENCOUNTER — Ambulatory Visit: Payer: PRIVATE HEALTH INSURANCE | Admitting: Physical Therapy

## 2012-12-05 ENCOUNTER — Ambulatory Visit: Payer: PRIVATE HEALTH INSURANCE | Attending: Orthopedic Surgery | Admitting: Physical Therapy

## 2012-12-05 DIAGNOSIS — M25669 Stiffness of unspecified knee, not elsewhere classified: Secondary | ICD-10-CM | POA: Insufficient documentation

## 2012-12-05 DIAGNOSIS — M6281 Muscle weakness (generalized): Secondary | ICD-10-CM | POA: Insufficient documentation

## 2012-12-05 DIAGNOSIS — IMO0001 Reserved for inherently not codable concepts without codable children: Secondary | ICD-10-CM | POA: Insufficient documentation

## 2012-12-05 DIAGNOSIS — R609 Edema, unspecified: Secondary | ICD-10-CM | POA: Insufficient documentation

## 2012-12-05 DIAGNOSIS — R269 Unspecified abnormalities of gait and mobility: Secondary | ICD-10-CM | POA: Insufficient documentation

## 2012-12-05 DIAGNOSIS — M25569 Pain in unspecified knee: Secondary | ICD-10-CM | POA: Insufficient documentation

## 2012-12-08 ENCOUNTER — Ambulatory Visit: Payer: PRIVATE HEALTH INSURANCE | Admitting: Physical Therapy

## 2012-12-09 ENCOUNTER — Encounter: Payer: Self-pay | Admitting: Physical Therapy

## 2012-12-12 ENCOUNTER — Ambulatory Visit: Payer: PRIVATE HEALTH INSURANCE | Admitting: Physical Therapy

## 2012-12-15 ENCOUNTER — Ambulatory Visit: Payer: PRIVATE HEALTH INSURANCE | Admitting: Physical Therapy

## 2012-12-19 ENCOUNTER — Ambulatory Visit: Payer: PRIVATE HEALTH INSURANCE | Admitting: Physical Therapy

## 2012-12-22 ENCOUNTER — Ambulatory Visit: Payer: PRIVATE HEALTH INSURANCE | Admitting: Physical Therapy

## 2012-12-26 ENCOUNTER — Ambulatory Visit: Payer: PRIVATE HEALTH INSURANCE | Admitting: Physical Therapy

## 2012-12-29 ENCOUNTER — Ambulatory Visit: Payer: PRIVATE HEALTH INSURANCE | Admitting: Physical Therapy

## 2013-01-05 ENCOUNTER — Encounter: Payer: Self-pay | Admitting: Physical Therapy

## 2013-08-29 ENCOUNTER — Other Ambulatory Visit: Payer: Self-pay | Admitting: Family Medicine

## 2013-08-29 DIAGNOSIS — R1900 Intra-abdominal and pelvic swelling, mass and lump, unspecified site: Secondary | ICD-10-CM

## 2013-08-30 ENCOUNTER — Ambulatory Visit
Admission: RE | Admit: 2013-08-30 | Discharge: 2013-08-30 | Disposition: A | Discharge status: Home / Self Care | Payer: BC Managed Care – PPO | Source: Ambulatory Visit | Attending: Family Medicine | Admitting: Family Medicine

## 2013-08-30 DIAGNOSIS — R1900 Intra-abdominal and pelvic swelling, mass and lump, unspecified site: Secondary | ICD-10-CM

## 2013-08-30 MED ORDER — IOHEXOL 300 MG/ML  SOLN
125.0000 mL | Freq: Once | INTRAMUSCULAR | Status: AC | PRN
  Administered 2013-08-30: 125 mL via INTRAVENOUS

## 2014-08-25 ENCOUNTER — Encounter: Payer: Self-pay | Admitting: *Deleted

## 2015-06-25 ENCOUNTER — Emergency Department (HOSPITAL_BASED_OUTPATIENT_CLINIC_OR_DEPARTMENT_OTHER): Payer: Managed Care, Other (non HMO)

## 2015-06-25 ENCOUNTER — Emergency Department (HOSPITAL_BASED_OUTPATIENT_CLINIC_OR_DEPARTMENT_OTHER)
Admission: EM | Admit: 2015-06-25 | Discharge: 2015-06-25 | Disposition: A | Discharge status: Home / Self Care | Payer: Managed Care, Other (non HMO) | Attending: Emergency Medicine | Admitting: Emergency Medicine

## 2015-06-25 ENCOUNTER — Encounter (HOSPITAL_BASED_OUTPATIENT_CLINIC_OR_DEPARTMENT_OTHER): Payer: Self-pay

## 2015-06-25 DIAGNOSIS — Z3202 Encounter for pregnancy test, result negative: Secondary | ICD-10-CM | POA: Insufficient documentation

## 2015-06-25 DIAGNOSIS — R1032 Left lower quadrant pain: Secondary | ICD-10-CM | POA: Diagnosis present

## 2015-06-25 DIAGNOSIS — R110 Nausea: Secondary | ICD-10-CM | POA: Insufficient documentation

## 2015-06-25 DIAGNOSIS — R102 Pelvic and perineal pain: Secondary | ICD-10-CM

## 2015-06-25 DIAGNOSIS — Z8669 Personal history of other diseases of the nervous system and sense organs: Secondary | ICD-10-CM | POA: Insufficient documentation

## 2015-06-25 HISTORY — DX: Malignant (primary) neoplasm, unspecified: C80.1

## 2015-06-25 LAB — CBC WITH DIFFERENTIAL/PLATELET
Basophils Absolute: 0 10*3/uL (ref 0.0–0.1)
Basophils Relative: 0 % (ref 0–1)
EOS PCT: 1 % (ref 0–5)
Eosinophils Absolute: 0.1 10*3/uL (ref 0.0–0.7)
HEMATOCRIT: 36.8 % (ref 36.0–46.0)
Hemoglobin: 12.6 g/dL (ref 12.0–15.0)
LYMPHS ABS: 1.8 10*3/uL (ref 0.7–4.0)
LYMPHS PCT: 24 % (ref 12–46)
MCH: 31.2 pg (ref 26.0–34.0)
MCHC: 34.2 g/dL (ref 30.0–36.0)
MCV: 91.1 fL (ref 78.0–100.0)
MONO ABS: 0.7 10*3/uL (ref 0.1–1.0)
Monocytes Relative: 9 % (ref 3–12)
Neutro Abs: 4.9 10*3/uL (ref 1.7–7.7)
Neutrophils Relative %: 66 % (ref 43–77)
PLATELETS: 280 10*3/uL (ref 150–400)
RBC: 4.04 MIL/uL (ref 3.87–5.11)
RDW: 13.2 % (ref 11.5–15.5)
WBC: 7.4 10*3/uL (ref 4.0–10.5)

## 2015-06-25 LAB — URINE MICROSCOPIC-ADD ON

## 2015-06-25 LAB — PREGNANCY, URINE: Preg Test, Ur: NEGATIVE

## 2015-06-25 LAB — URINALYSIS, ROUTINE W REFLEX MICROSCOPIC
Bilirubin Urine: NEGATIVE
Glucose, UA: NEGATIVE mg/dL
KETONES UR: NEGATIVE mg/dL
LEUKOCYTES UA: NEGATIVE
NITRITE: NEGATIVE
PH: 7 (ref 5.0–8.0)
Protein, ur: NEGATIVE mg/dL
Specific Gravity, Urine: 1.017 (ref 1.005–1.030)
Urobilinogen, UA: 0.2 mg/dL (ref 0.0–1.0)

## 2015-06-25 LAB — COMPREHENSIVE METABOLIC PANEL
ALT: 18 U/L (ref 14–54)
AST: 15 U/L (ref 15–41)
Albumin: 3.9 g/dL (ref 3.5–5.0)
Alkaline Phosphatase: 48 U/L (ref 38–126)
Anion gap: 8 (ref 5–15)
BILIRUBIN TOTAL: 0.4 mg/dL (ref 0.3–1.2)
BUN: 10 mg/dL (ref 6–20)
CHLORIDE: 106 mmol/L (ref 101–111)
CO2: 27 mmol/L (ref 22–32)
CREATININE: 0.52 mg/dL (ref 0.44–1.00)
Calcium: 9.5 mg/dL (ref 8.9–10.3)
Glucose, Bld: 98 mg/dL (ref 65–99)
Potassium: 3.8 mmol/L (ref 3.5–5.1)
Sodium: 141 mmol/L (ref 135–145)
TOTAL PROTEIN: 7.1 g/dL (ref 6.5–8.1)

## 2015-06-25 LAB — WET PREP, GENITAL
Clue Cells Wet Prep HPF POC: NONE SEEN
TRICH WET PREP: NONE SEEN
YEAST WET PREP: NONE SEEN

## 2015-06-25 MED ORDER — HYDROMORPHONE HCL 1 MG/ML IJ SOLN
1.0000 mg | Freq: Once | INTRAMUSCULAR | Status: AC
  Administered 2015-06-25: 1 mg via INTRAVENOUS
  Filled 2015-06-25: qty 1

## 2015-06-25 MED ORDER — ONDANSETRON HCL 4 MG/2ML IJ SOLN
4.0000 mg | Freq: Once | INTRAMUSCULAR | Status: AC
  Administered 2015-06-25: 4 mg via INTRAVENOUS
  Filled 2015-06-25: qty 2

## 2015-06-25 MED ORDER — IOHEXOL 300 MG/ML  SOLN
25.0000 mL | Freq: Once | INTRAMUSCULAR | Status: AC | PRN
  Administered 2015-06-25: 25 mL via ORAL

## 2015-06-25 MED ORDER — ONDANSETRON 4 MG PO TBDP: 4.0000 mg | ORAL_TABLET | Freq: Three times a day (TID) | ORAL | Status: DC | PRN

## 2015-06-25 MED ORDER — IOHEXOL 300 MG/ML  SOLN
100.0000 mL | Freq: Once | INTRAMUSCULAR | Status: AC | PRN
  Administered 2015-06-25: 100 mL via INTRAVENOUS

## 2015-06-25 MED ORDER — HYDROCODONE-ACETAMINOPHEN 5-325 MG PO TABS: ORAL_TABLET | ORAL | Status: DC

## 2015-06-25 NOTE — ED Notes (Signed)
Patient transported to Ultrasound 

## 2015-06-25 NOTE — ED Notes (Signed)
RLQ pain-started last night

## 2015-06-25 NOTE — Discharge Instructions (Signed)
Please read and follow all provided instructions.  Your diagnoses today include:  1. Pelvic pain in female     Tests performed today include:  Blood counts and electrolytes  Blood tests to check liver and kidney function  Blood tests to check pancreas function  Urine test to look for infection and pregnancy (in women)  CT scan and pelvic ultrasound - no definite cause found for your pain  Vital signs. See below for your results today.   Medications prescribed:   Vicodin (hydrocodone/acetaminophen) - narcotic pain medication  DO NOT drive or perform any activities that require you to be awake and alert because this medicine can make you drowsy. BE VERY CAREFUL not to take multiple medicines containing Tylenol (also called acetaminophen). Doing so can lead to an overdose which can damage your liver and cause liver failure and possibly death.   Zofran (ondansetron) - for nausea and vomiting  Take any prescribed medications only as directed.  Home care instructions:   Follow any educational materials contained in this packet.  Follow-up instructions: Please follow-up with your primary care provider in the next 7 days for further evaluation of your symptoms.    Return instructions:  SEEK IMMEDIATE MEDICAL ATTENTION IF:  The pain does not go away or becomes severe   A temperature above 101F develops   Repeated vomiting occurs (multiple episodes)   The pain becomes localized to portions of the abdomen. The right side could possibly be appendicitis. In an adult, the left lower portion of the abdomen could be colitis or diverticulitis.   Blood is being passed in stools or vomit (bright red or black tarry stools)   You develop chest pain, difficulty breathing, dizziness or fainting, or become confused, poorly responsive, or inconsolable (young children)  If you have any other emergent concerns regarding your health  Additional Information: Abdominal (belly) pain can be  caused by many things. Your caregiver performed an examination and possibly ordered blood/urine tests and imaging (CT scan, x-rays, ultrasound). Many cases can be observed and treated at home after initial evaluation in the emergency department. Even though you are being discharged home, abdominal pain can be unpredictable. Therefore, you need a repeated exam if your pain does not resolve, returns, or worsens. Most patients with abdominal pain don't have to be admitted to the hospital or have surgery, but serious problems like appendicitis and gallbladder attacks can start out as nonspecific pain. Many abdominal conditions cannot be diagnosed in one visit, so follow-up evaluations are very important.  Your vital signs today were: BP 115/67 mmHg   Pulse 81   Temp(Src) 98.3 F (36.8 C) (Oral)   Resp 18   Ht 5' (1.524 m)   Wt 215 lb (97.523 kg)   BMI 41.99 kg/m2   SpO2 98%   LMP 06/21/2015 If your blood pressure (bp) was elevated above 135/85 this visit, please have this repeated by your doctor within one month. --------------

## 2015-06-25 NOTE — ED Provider Notes (Signed)
CSN: [id number]     Arrival date & time 06/25/15  1254 History   First MD Initiated Contact with Patient 06/25/15 1305     Chief Complaint  Patient presents with  . Abdominal Pain     (Consider location/radiation/quality/duration/timing/severity/associated sxs/prior Treatment) HPI Comments: Patient with history of C-section and tubal ligation presents with complaint of acute onset of right pelvis and lower quadrant abdominal pain starting last night. Pain was abrupt in onset. It does not radiate. It is associated with nausea and no vomiting. No fevers. Patient just finished her menstrual period. No vaginal bleeding or discharge currently. No pain with urination, hematuria, flank pain or back pain. Patient states that she had similar symptoms to this 2 months ago and again last month. Last month she had a CT scan which demonstrated a small hernia only. She has not had a pelvic ultrasound. 2 months ago when the pain struck her she actually passed out while in her kitchen due to the pain. Patient is not sexually active and has not been so in 6 years (states that her husband has cardiac problems). No treatments prior to arrival. Patient states that she does not want any pain medication. Onset of symptoms acute. Course is constant. Palpation and movement make the pain worse. Nothing makes it better.  Patient is a 49 y.o. female presenting with abdominal pain. The history is provided by the patient.  Abdominal Pain Associated symptoms: nausea   Associated symptoms: no chest pain, no cough, no diarrhea, no dysuria, no fever, no sore throat, no vaginal bleeding, no vaginal discharge and no vomiting     Past Medical History  Diagnosis Date  . Left knee pain   . Carpal tunnel syndrome, bilateral   . Complication of anesthesia INTRAOP HYPERTENSION W/ TONSILLECTOMY SURGERY AGE 60--  NO ISSUES SINCE   Past Surgical History  Procedure Laterality Date  . Tonsillectomy  AGE 60  . Cholecystectomy  2009  .  Anterior cervical decomp/discectomy fusion  10-16-2010    C4  -  C6  . Cesarean section  1987  . Tubal ligation  1988  . Cervical biopsy  w/ loop electrode excision  2010  . Foot surgery  2010    LEFT  . Transthoracic echocardiogram  06-20-2010    NORMAL LVSF/ EF 55%  . Knee arthroscopy  10/20/2012    Procedure: ARTHROSCOPY KNEE;  Surgeon: Mauri Pole, MD;  Location: [facility];  Service: Orthopedics;  Laterality: Left;  medial menisectomy medial patellar condroplasty and sinovectomy   Family History  Problem Relation Age of Onset  . Cancer Mother   . Diabetes Mother   . Cancer Paternal Grandmother    Social History  Substance Use Topics  . Smoking status: Never Smoker   . Smokeless tobacco: Never Used  . Alcohol Use: No   OB History    No data available     Review of Systems  Constitutional: Negative for fever.  HENT: Negative for rhinorrhea and sore throat.   Eyes: Negative for redness.  Respiratory: Negative for cough.   Cardiovascular: Negative for chest pain.  Gastrointestinal: Positive for nausea and abdominal pain. Negative for vomiting, diarrhea and blood in stool.  Genitourinary: Positive for pelvic pain. Negative for dysuria, urgency, frequency, flank pain, decreased urine volume, vaginal bleeding and vaginal discharge.  Musculoskeletal: Negative for myalgias.  Skin: Negative for rash.  Neurological: Negative for headaches.      Allergies  Cortisone; Naproxen sodium; and Prednisone  Home  Medications   Prior to Admission medications   Not on File   BP 133/88 mmHg  Pulse 97  Temp(Src) 98.3 F (36.8 C) (Oral)  Resp 18  Ht 5' (1.524 m)  Wt 215 lb (97.523 kg)  BMI 41.99 kg/m2  SpO2 98%   Physical Exam  Constitutional: She appears well-developed and well-nourished.  HENT:  Head: Normocephalic and atraumatic.  Eyes: Conjunctivae are normal. Right eye exhibits no discharge. Left eye exhibits no discharge.  Neck: Normal range of  motion. Neck supple.  Cardiovascular: Normal rate, regular rhythm and normal heart sounds.   Pulmonary/Chest: Effort normal and breath sounds normal.  Abdominal: Soft. She exhibits no distension. There is tenderness (moderate tenderness) in the right lower quadrant and suprapubic area. There is no rebound, no guarding and no CVA tenderness.    Genitourinary: Uterus normal. There is no rash or tenderness on the right labia. There is no rash or tenderness on the left labia. Uterus is not tender. Cervix exhibits no motion tenderness, no discharge and no friability. Right adnexum displays tenderness. Right adnexum displays no mass. Left adnexum displays no mass and no tenderness. No erythema, tenderness or bleeding in the vagina. No vaginal discharge found.  Neurological: She is alert.  Skin: Skin is warm and dry.  Psychiatric: She has a normal mood and affect.  Nursing note and vitals reviewed.   ED Course  Procedures (including critical care time) Labs Review Labs Reviewed  WET PREP, GENITAL - Abnormal; Notable for the following:    WBC, Wet Prep HPF POC FEW (*)    All other components within normal limits  URINALYSIS, ROUTINE W REFLEX MICROSCOPIC (NOT AT [facility]) - Abnormal; Notable for the following:    APPearance CLOUDY (*)    Hgb urine dipstick LARGE (*)    All other components within normal limits  URINE MICROSCOPIC-ADD ON - Abnormal; Notable for the following:    Squamous Epithelial / LPF FEW (*)    Bacteria, UA MANY (*)    All other components within normal limits  PREGNANCY, URINE  CBC WITH DIFFERENTIAL/PLATELET  COMPREHENSIVE METABOLIC PANEL  GC/CHLAMYDIA PROBE AMP ([place]) NOT AT [facility]    Imaging Review US Transvaginal Non-ob  06/25/2015   CLINICAL DATA:  Right lower quadrant pain for 3 months. History of C-section and cervical carcinoma.  EXAM: TRANSABDOMINAL AND TRANSVAGINAL ULTRASOUND OF PELVIS  TECHNIQUE: Both transabdominal and transvaginal ultrasound examinations of  the pelvis were performed. Transabdominal technique was performed for global imaging of the pelvis including uterus, ovaries, adnexal regions, and pelvic cul-de-sac. It was necessary to proceed with endovaginal exam following the transabdominal exam to visualize the uterus and endometrium and ovaries to better advantage.  COMPARISON:  CT, 08/30/2013  FINDINGS: Uterus  Measurements: 10 cm x 5.5 cm x 6.8 cm. Echogenicity is somewhat heterogeneous, but there is no discrete mass. Small nabothian cyst noted in the distal cervix.  Endometrium  Thickness: 7 mm.  No focal abnormality visualized.  Right ovary  Measurements: 2.8 x 2.3 x 2 cm. Only visualized transvaginally. No right adnexal mass.  Left ovary  Measurements: 2.9 x 1.9 x 1.8 cm. Only visualized transvaginally. No left adnexal mass.  Other findings  Trace pelvic free fluid.  IMPRESSION: 1. No acute findings. No findings to explain the patient's chronic right lower quadrant pain. 2. Exam somewhat limited by this patient's body habitus. Overall, no convincing abnormality of the uterus, endometrium or ovaries. No adnexal masses.  Right lower quadrant pain for Please select  correct "US Pelvis" template depending on combination of exams performed / being read (e.g. both, Doppler, etc).   Electronically Signed   By: Lajean Manes M.D.   On: 06/25/2015 14:29   US Pelvis Complete  06/25/2015   CLINICAL DATA:  Right lower quadrant pain for 3 months. History of C-section and cervical carcinoma.  EXAM: TRANSABDOMINAL AND TRANSVAGINAL ULTRASOUND OF PELVIS  TECHNIQUE: Both transabdominal and transvaginal ultrasound examinations of the pelvis were performed. Transabdominal technique was performed for global imaging of the pelvis including uterus, ovaries, adnexal regions, and pelvic cul-de-sac. It was necessary to proceed with endovaginal exam following the transabdominal exam to visualize the uterus and endometrium and ovaries to better advantage.  COMPARISON:  CT,  08/30/2013  FINDINGS: Uterus  Measurements: 10 cm x 5.5 cm x 6.8 cm. Echogenicity is somewhat heterogeneous, but there is no discrete mass. Small nabothian cyst noted in the distal cervix.  Endometrium  Thickness: 7 mm.  No focal abnormality visualized.  Right ovary  Measurements: 2.8 x 2.3 x 2 cm. Only visualized transvaginally. No right adnexal mass.  Left ovary  Measurements: 2.9 x 1.9 x 1.8 cm. Only visualized transvaginally. No left adnexal mass.  Other findings  Trace pelvic free fluid.  IMPRESSION: 1. No acute findings. No findings to explain the patient's chronic right lower quadrant pain. 2. Exam somewhat limited by this patient's body habitus. Overall, no convincing abnormality of the uterus, endometrium or ovaries. No adnexal masses.  Right lower quadrant pain for Please select correct "US Pelvis" template depending on combination of exams performed / being read (e.g. both, Doppler, etc).   Electronically Signed   By: Lajean Manes M.D.   On: 06/25/2015 14:29   Ct Abdomen Pelvis W Contrast  06/25/2015   CLINICAL DATA:  Intermittent right lower quadrant pain x3 months, started again last night. Nausea.  EXAM: CT ABDOMEN AND PELVIS WITH CONTRAST  TECHNIQUE: Multidetector CT imaging of the abdomen and pelvis was performed using the standard protocol following bolus administration of intravenous contrast.  CONTRAST:  174mL OMNIPAQUE IOHEXOL 300 MG/ML SOLN, 45mL OMNIPAQUE IOHEXOL 300 MG/ML SOLN  COMPARISON:  08/30/2013  FINDINGS: Lower chest:  Lung bases are clear.  Hepatobiliary: Liver is within normal limits.  Status post cholecystectomy. No intrahepatic or extrahepatic ductal dilatation.  Pancreas: Within normal limits.  Spleen: Within normal limits.  Adrenals/Urinary Tract: Adrenal glands are within normal limits.  9 mm cyst in the posterior left lower kidney (series 7/ image 20). Right kidney is within normal limits.  Bladder is within normal limits.  Stomach/Bowel: Stomach is within normal limits.  No  evidence of bowel obstruction.  Normal appendix.  Vascular/Lymphatic: No evidence of abdominal aortic aneurysm.  No suspicious abdominopelvic lymphadenopathy.  Reproductive: Uterus is within normal limits.  Bilateral ovaries are within normal limits.  Other: No abdominopelvic ascites.  Small fat containing periumbilical hernia (series 2/ image 66).  Musculoskeletal: Degenerative changes of the visualized thoracolumbar spine.  IMPRESSION: No evidence of bowel obstruction.  Normal appendix.  Small fat containing periumbilical hernia, chronic.  Status post cholecystectomy.  No CT findings to account for the patient's chronic right lower quadrant abdominal pain.   Electronically Signed   By: Julian Hy M.D.   On: 06/25/2015 16:25   I have personally reviewed and evaluated these images and lab results as part of my medical decision-making.   EKG Interpretation None       1:30 PM Patient seen and examined. Work-up initiated. Patient declines pain medication.  [place] ordered.   Vital signs reviewed and are as follows: BP 133/88 mmHg  Pulse 97  Temp(Src) 98.3 F (36.8 C) (Oral)  Resp 18  Ht 5' (1.524 m)  Wt 215 lb (97.523 kg)  BMI 41.99 kg/m2  SpO2 98%  3:30 PM Pelvic exam performed with nurse chaperone. She has exquisite tenderness in R adnexa. [place] unrevealing -- however after discussion with ultrasonographer, due to bowel gas limitations, neither ovary could be evaluated with doppler to confirm blood flow.   I reviewed CT scan from previous visit to [facility] ED.   I discussed findings with patient to this point. Given the fact that ovarian torsion is on differential, feel that we should proceed with CT imaging given limitations from ultrasound. Patient agrees with this and understands reasoning. Will give pain medication. If negative, patient knows that she will likely need to follow-up with gynecology for further evaluation.  4:33 PM CT performed and is negative. Pt to f/u with GYN. Will  give additional dose of pain medication here as well as PO challenge. Anticipate d/c to home with GYN referrals.   6:09 PM Patient feels somewhat better after second dose of pain medication. No vomiting. She is dressed and ready to go. Will discharge to home. Encouraged GYN follow-up as soon as possible.  Patient counseled on use of narcotic pain medications. Counseled not to combine these medications with others containing tylenol. Urged not to drink alcohol, drive, or perform any other activities that requires focus while taking these medications. The patient verbalizes understanding and agrees with the plan.  The patient was urged to return to the Emergency Department immediately with worsening of current symptoms, worsening abdominal pain, persistent vomiting, blood noted in stools, fever, or any other concerns. The patient verbalized understanding.     MDM   Final diagnoses:  Pelvic pain in female   Patients with pelvic pain. She is not sexually active. Ultrasound today was unrevealing. Given due to inability to ensure no ovarian torsion, CT scan was ordered. This was also negative. Symptoms currently controlled in emergency department. At this time there is no indication for admission to the hospital. She should be seen by a gynecologist for further evaluation and workup.    Carlisle Cater, PA-C 06/25/15 1812  Debby Freiberg, MD 06/26/15 [phone]

## 2015-06-27 LAB — GC/CHLAMYDIA PROBE AMP (~~LOC~~) NOT AT ARMC
Chlamydia: NEGATIVE
Neisseria Gonorrhea: NEGATIVE

## 2015-07-01 ENCOUNTER — Encounter: Payer: Self-pay | Admitting: Obstetrics & Gynecology

## 2015-07-01 ENCOUNTER — Ambulatory Visit (INDEPENDENT_AMBULATORY_CARE_PROVIDER_SITE_OTHER): Payer: Managed Care, Other (non HMO) | Admitting: Obstetrics & Gynecology

## 2015-07-01 VITALS — BP 138/94 | HR 79 | Ht 60.0 in | Wt 219.0 lb

## 2015-07-01 DIAGNOSIS — N920 Excessive and frequent menstruation with regular cycle: Secondary | ICD-10-CM

## 2015-07-01 DIAGNOSIS — R102 Pelvic and perineal pain: Secondary | ICD-10-CM

## 2015-07-01 MED ORDER — MEGESTROL ACETATE 40 MG PO TABS: 80.0000 mg | ORAL_TABLET | Freq: Two times a day (BID) | ORAL | Status: DC

## 2015-07-01 MED ORDER — ONDANSETRON 4 MG PO TBDP: 4.0000 mg | ORAL_TABLET | Freq: Three times a day (TID) | ORAL | Status: DC | PRN

## 2015-07-01 MED ORDER — HYDROCODONE-ACETAMINOPHEN 5-325 MG PO TABS: ORAL_TABLET | ORAL | Status: DC

## 2015-07-01 NOTE — Progress Notes (Signed)
Patient ID: [name], female   DOB: January 31, 1966, 49 y.o.   MRN: [id number] History:  49 y.o. G1P1 s/p BTL. LMP 8/19.  Presents today for pelvic pain that started 3 months prev.  She was seen in the ED at [facility] 8/23 and had a sono and a CT.  The pain is becoming progressively worse.  The pain is mainly on th eright side.  She has been on Vodocin and Zofran in order to work.  She had passed out 3x once with with nausea.  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  The pain meds do not relieve the pain but, improves it.  Nothing else makes the pain better.  She thinks that sitting is worise than lying in regards to the pain.  Pain is 10/0 sitting and 7/10 lying.  Had a PAP last year that was normal by Dina Rich in [place]. Pt reports 3 months with no cycles and then when the bleeding returned it was heavy sand the pain started.  The pain is worse with cycles.   The following portions of the patient's history were reviewed and updated as appropriate: allergies, current medications, past family history, past medical history, past social history, past surgical history and problem list.  Past Medical History  Diagnosis Date  . Left knee pain   . Carpal tunnel syndrome, bilateral   . Complication of anesthesia INTRAOP HYPERTENSION W/ TONSILLECTOMY SURGERY AGE 32--  NO ISSUES SINCE  . Cancer 1990    Cervical    Past Surgical History  Procedure Laterality Date  . Tonsillectomy  AGE 32  . Cholecystectomy  2009  . Anterior cervical decomp/discectomy fusion  10-16-2010    C4  -  C6  . Cesarean section  1987  . Tubal ligation  1988  . Cervical biopsy  w/ loop electrode excision  2010  . Foot surgery  2010    LEFT  . Transthoracic echocardiogram  06-20-2010    NORMAL LVSF/ EF 55%  . Knee arthroscopy  10/20/2012    Procedure: ARTHROSCOPY KNEE;  Surgeon: Mauri Pole, MD;  Location: [facility];  Service: Orthopedics;  Laterality: Left;  medial  menisectomy medial patellar condroplasty and sinovectomy   Current Outpatient Prescriptions on File Prior to Visit  Medication Sig Dispense Refill  . HYDROcodone-acetaminophen (NORCO/VICODIN) 5-325 MG per tablet Take 1-2 tablets every 6 hours as needed for severe pain 12 tablet 0  . ondansetron (ZOFRAN ODT) 4 MG disintegrating tablet Take 1 tablet (4 mg total) by mouth every 8 (eight) hours as needed for nausea or vomiting. 10 tablet 0   No current facility-administered medications on file prior to visit.   Allergies  Allergen Reactions  . Cortisone Hives  . Naproxen Sodium Hives    aleve  . Prednisone Hives    Review of Systems:  Pt reports normal bowel and bladder function.  Last BM yesterday.   Objective:  Physical Exam Blood pressure 138/94, pulse 79, height 5' (1.524 m), weight 219 lb (99.338 kg), last menstrual period 06/21/2015. Gen: NAD; pt appears uncomfortable. Abd: Soft, obese, tender in RLQ and nondistended.  Well healed vertical incision Pelvic: Normal appearing external genitalia; normal appearing vaginal mucosa and cervix.  Normal discharge.  Small uterus, no other palpable masses, no uterine or adnexal tenderness  CBC    Component Value Date/Time   WBC 7.4 06/25/2015 1330   RBC 4.04 06/25/2015 1330   HGB  12.6 06/25/2015 1330   HCT 36.8 06/25/2015 1330   PLT 280 06/25/2015 1330   MCV 91.1 06/25/2015 1330   MCH 31.2 06/25/2015 1330   MCHC 34.2 06/25/2015 1330   RDW 13.2 06/25/2015 1330   LYMPHSABS 1.8 06/25/2015 1330   MONOABS 0.7 06/25/2015 1330   EOSABS 0.1 06/25/2015 1330   BASOSABS 0.0 06/25/2015 1330     Labs and Imaging US Transvaginal Non-ob  06/25/2015   CLINICAL DATA:  Right lower quadrant pain for 3 months. History of C-section and cervical carcinoma.  EXAM: TRANSABDOMINAL AND TRANSVAGINAL ULTRASOUND OF PELVIS  TECHNIQUE: Both transabdominal and transvaginal ultrasound examinations of the pelvis were performed. Transabdominal technique was  performed for global imaging of the pelvis including uterus, ovaries, adnexal regions, and pelvic cul-de-sac. It was necessary to proceed with endovaginal exam following the transabdominal exam to visualize the uterus and endometrium and ovaries to better advantage.  COMPARISON:  CT, 08/30/2013  FINDINGS: Uterus  Measurements: 10 cm x 5.5 cm x 6.8 cm. Echogenicity is somewhat heterogeneous, but there is no discrete mass. Small nabothian cyst noted in the distal cervix.  Endometrium  Thickness: 7 mm.  No focal abnormality visualized.  Right ovary  Measurements: 2.8 x 2.3 x 2 cm. Only visualized transvaginally. No right adnexal mass.  Left ovary  Measurements: 2.9 x 1.9 x 1.8 cm. Only visualized transvaginally. No left adnexal mass.  Other findings  Trace pelvic free fluid.  IMPRESSION: 1. No acute findings. No findings to explain the patient's chronic right lower quadrant pain. 2. Exam somewhat limited by this patient's body habitus. Overall, no convincing abnormality of the uterus, endometrium or ovaries. No adnexal masses.  Right lower quadrant pain for Please select correct "US Pelvis" template depending on combination of exams performed / being read (e.g. both, Doppler, etc).   Electronically Signed   By: Lajean Manes M.D.   On: 06/25/2015 14:29   US Pelvis Complete  06/25/2015   CLINICAL DATA:  Right lower quadrant pain for 3 months. History of C-section and cervical carcinoma.  EXAM: TRANSABDOMINAL AND TRANSVAGINAL ULTRASOUND OF PELVIS  TECHNIQUE: Both transabdominal and transvaginal ultrasound examinations of the pelvis were performed. Transabdominal technique was performed for global imaging of the pelvis including uterus, ovaries, adnexal regions, and pelvic cul-de-sac. It was necessary to proceed with endovaginal exam following the transabdominal exam to visualize the uterus and endometrium and ovaries to better advantage.  COMPARISON:  CT, 08/30/2013  FINDINGS: Uterus  Measurements: 10 cm x 5.5 cm x  6.8 cm. Echogenicity is somewhat heterogeneous, but there is no discrete mass. Small nabothian cyst noted in the distal cervix.  Endometrium  Thickness: 7 mm.  No focal abnormality visualized.  Right ovary  Measurements: 2.8 x 2.3 x 2 cm. Only visualized transvaginally. No right adnexal mass.  Left ovary  Measurements: 2.9 x 1.9 x 1.8 cm. Only visualized transvaginally. No left adnexal mass.  Other findings  Trace pelvic free fluid.  IMPRESSION: 1. No acute findings. No findings to explain the patient's chronic right lower quadrant pain. 2. Exam somewhat limited by this patient's body habitus. Overall, no convincing abnormality of the uterus, endometrium or ovaries. No adnexal masses.  Right lower quadrant pain for Please select correct "US Pelvis" template depending on combination of exams performed / being read (e.g. both, Doppler, etc).   Electronically Signed   By: Lajean Manes M.D.   On: 06/25/2015 14:29   Ct Abdomen Pelvis W Contrast  06/25/2015   CLINICAL DATA:  Intermittent right lower quadrant pain x3 months, started again last night. Nausea.  EXAM: CT ABDOMEN AND PELVIS WITH CONTRAST  TECHNIQUE: Multidetector CT imaging of the abdomen and pelvis was performed using the standard protocol following bolus administration of intravenous contrast.  CONTRAST:  139mL OMNIPAQUE IOHEXOL 300 MG/ML SOLN, 43mL OMNIPAQUE IOHEXOL 300 MG/ML SOLN  COMPARISON:  08/30/2013  FINDINGS: Lower chest:  Lung bases are clear.  Hepatobiliary: Liver is within normal limits.  Status post cholecystectomy. No intrahepatic or extrahepatic ductal dilatation.  Pancreas: Within normal limits.  Spleen: Within normal limits.  Adrenals/Urinary Tract: Adrenal glands are within normal limits.  9 mm cyst in the posterior left lower kidney (series 7/ image 20). Right kidney is within normal limits.  Bladder is within normal limits.  Stomach/Bowel: Stomach is within normal limits.  No evidence of bowel obstruction.  Normal appendix.   Vascular/Lymphatic: No evidence of abdominal aortic aneurysm.  No suspicious abdominopelvic lymphadenopathy.  Reproductive: Uterus is within normal limits.  Bilateral ovaries are within normal limits.  Other: No abdominopelvic ascites.  Small fat containing periumbilical hernia (series 2/ image 10).  Musculoskeletal: Degenerative changes of the visualized thoracolumbar spine.  IMPRESSION: No evidence of bowel obstruction.  Normal appendix.  Small fat containing periumbilical hernia, chronic.  Status post cholecystectomy.  No CT findings to account for the patient's chronic right lower quadrant abdominal pain.   Electronically Signed   By: Julian Hy M.D.   On: 06/25/2015 16:25    Assessment & Plan:  Pelvic pain x 3 months.  No clear etiology.  Possible pain related to menorrhagia due to perimenopause.   Will attempt to stop menses and see if pain is relieved.   Megace 80mg  bid x 1 months and then 40mg  bid  F/u in 9 weeks or sooner prn Vicodin #20 Zofran 4mg  prn nausea If pain persists or worsens, may need dx laparoscopy to eval   Aima Mcwhirt L. Harraway-Smith, M.D., Cherlynn June

## 2015-07-01 NOTE — Patient Instructions (Signed)

## 2015-08-21 ENCOUNTER — Ambulatory Visit (INDEPENDENT_AMBULATORY_CARE_PROVIDER_SITE_OTHER): Payer: Managed Care, Other (non HMO) | Admitting: Obstetrics & Gynecology

## 2015-08-21 ENCOUNTER — Encounter: Payer: Self-pay | Admitting: Obstetrics & Gynecology

## 2015-08-21 VITALS — BP 124/87 | HR 101 | Ht 61.0 in | Wt 227.0 lb

## 2015-08-21 DIAGNOSIS — Z23 Encounter for immunization: Secondary | ICD-10-CM

## 2015-08-21 DIAGNOSIS — Z0000 Encounter for general adult medical examination without abnormal findings: Secondary | ICD-10-CM

## 2015-08-21 DIAGNOSIS — R102 Pelvic and perineal pain: Secondary | ICD-10-CM | POA: Diagnosis not present

## 2015-08-21 MED ORDER — MEGESTROL ACETATE 40 MG PO TABS: 40.0000 mg | ORAL_TABLET | Freq: Two times a day (BID) | ORAL | Status: DC

## 2015-08-21 NOTE — Progress Notes (Signed)
   Subjective:    Patient ID: [name], female    DOB: 07-Dec-1965, 49 y.o.   MRN: [id number]  HPI  49 yo perimenopausal lady here for her follow up after being started on megace 80 mg BID. She is absolutely thrilled that this has completely relieved her pain.  Review of Systems She is due for a mammogram and flu vaccine. She reports a normal pap that is UTD.    Objective:   Physical Exam [id number] Breathing, conversing, and ambulating normally      Assessment & Plan:  Pelvic pain relieved with megace- I have decreased her dose to 40 mg BID per Dr. Bonnetta Barry note.

## 2015-08-26 ENCOUNTER — Ambulatory Visit: Payer: Managed Care, Other (non HMO) | Admitting: Obstetrics & Gynecology

## 2015-08-29 ENCOUNTER — Ambulatory Visit (HOSPITAL_BASED_OUTPATIENT_CLINIC_OR_DEPARTMENT_OTHER): Payer: Managed Care, Other (non HMO)

## 2015-08-30 ENCOUNTER — Ambulatory Visit (HOSPITAL_BASED_OUTPATIENT_CLINIC_OR_DEPARTMENT_OTHER)
Admission: RE | Admit: 2015-08-30 | Discharge: 2015-08-30 | Disposition: A | Discharge status: Home / Self Care | Payer: Managed Care, Other (non HMO) | Source: Ambulatory Visit | Attending: Obstetrics & Gynecology | Admitting: Obstetrics & Gynecology

## 2015-08-30 DIAGNOSIS — Z1231 Encounter for screening mammogram for malignant neoplasm of breast: Secondary | ICD-10-CM | POA: Diagnosis present

## 2015-08-30 DIAGNOSIS — Z0000 Encounter for general adult medical examination without abnormal findings: Secondary | ICD-10-CM

## 2015-12-11 ENCOUNTER — Emergency Department (HOSPITAL_BASED_OUTPATIENT_CLINIC_OR_DEPARTMENT_OTHER)
Admission: EM | Admit: 2015-12-11 | Discharge: 2015-12-11 | Disposition: A | Discharge status: Home / Self Care | Payer: Managed Care, Other (non HMO) | Attending: Emergency Medicine | Admitting: Emergency Medicine

## 2015-12-11 ENCOUNTER — Encounter (HOSPITAL_BASED_OUTPATIENT_CLINIC_OR_DEPARTMENT_OTHER): Payer: Self-pay

## 2015-12-11 DIAGNOSIS — Z79899 Other long term (current) drug therapy: Secondary | ICD-10-CM | POA: Insufficient documentation

## 2015-12-11 DIAGNOSIS — Z8541 Personal history of malignant neoplasm of cervix uteri: Secondary | ICD-10-CM | POA: Insufficient documentation

## 2015-12-11 DIAGNOSIS — S4992XA Unspecified injury of left shoulder and upper arm, initial encounter: Secondary | ICD-10-CM | POA: Diagnosis present

## 2015-12-11 DIAGNOSIS — T148XXA Other injury of unspecified body region, initial encounter: Secondary | ICD-10-CM

## 2015-12-11 DIAGNOSIS — Z8669 Personal history of other diseases of the nervous system and sense organs: Secondary | ICD-10-CM | POA: Insufficient documentation

## 2015-12-11 DIAGNOSIS — Y998 Other external cause status: Secondary | ICD-10-CM | POA: Insufficient documentation

## 2015-12-11 DIAGNOSIS — S29002A Unspecified injury of muscle and tendon of back wall of thorax, initial encounter: Secondary | ICD-10-CM | POA: Insufficient documentation

## 2015-12-11 DIAGNOSIS — Y92410 Unspecified street and highway as the place of occurrence of the external cause: Secondary | ICD-10-CM | POA: Insufficient documentation

## 2015-12-11 DIAGNOSIS — T148 Other injury of unspecified body region: Secondary | ICD-10-CM | POA: Insufficient documentation

## 2015-12-11 DIAGNOSIS — Y9389 Activity, other specified: Secondary | ICD-10-CM | POA: Insufficient documentation

## 2015-12-11 MED ORDER — HYDROCODONE-ACETAMINOPHEN 5-325 MG PO TABS: 1.0000 | ORAL_TABLET | ORAL | Status: DC | PRN

## 2015-12-11 MED ORDER — METHOCARBAMOL 500 MG PO TABS
1000.0000 mg | ORAL_TABLET | Freq: Once | ORAL | Status: AC
  Administered 2015-12-11: 1000 mg via ORAL
  Filled 2015-12-11: qty 2

## 2015-12-11 MED ORDER — METHOCARBAMOL 500 MG PO TABS: 1000.0000 mg | ORAL_TABLET | Freq: Four times a day (QID) | ORAL | Status: DC | PRN

## 2015-12-11 MED ORDER — HYDROCODONE-ACETAMINOPHEN 5-325 MG PO TABS
1.0000 | ORAL_TABLET | Freq: Once | ORAL | Status: AC
  Administered 2015-12-11: 1 via ORAL
  Filled 2015-12-11: qty 1

## 2015-12-11 MED FILL — METHOCARBAMOL 500 MG TABLET: 500 | 4 days supply | Qty: 30 | Fill #0

## 2015-12-11 MED FILL — HYDROCODON-APAP 5-325: 5-325 | 2 days supply | Qty: 10 | Fill #0
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
## 2015-12-11 NOTE — ED Notes (Signed)
DC instructions and Rx x 2 reviewed with pt, discussed follow up if needed with primary care MD, also discussed safety while taking PO pain meds as written by EDP. Pt teaching done re: non-pharmacological pain control.Opportunity for questions provided

## 2015-12-11 NOTE — ED Notes (Signed)
C/o left sided neck pain and left arm pain

## 2015-12-11 NOTE — ED Notes (Signed)
MD at bedside. 
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
## 2015-12-12 ENCOUNTER — Emergency Department (HOSPITAL_COMMUNITY)
Admission: EM | Admit: 2015-12-12 | Discharge: 2015-12-12 | Disposition: A | Discharge status: Home / Self Care | Payer: Managed Care, Other (non HMO) | Attending: Emergency Medicine | Admitting: Emergency Medicine

## 2015-12-12 ENCOUNTER — Emergency Department (HOSPITAL_COMMUNITY): Payer: Managed Care, Other (non HMO)

## 2015-12-12 ENCOUNTER — Encounter (HOSPITAL_COMMUNITY): Payer: Self-pay

## 2015-12-12 DIAGNOSIS — Z79899 Other long term (current) drug therapy: Secondary | ICD-10-CM | POA: Diagnosis not present

## 2015-12-12 DIAGNOSIS — Z8541 Personal history of malignant neoplasm of cervix uteri: Secondary | ICD-10-CM | POA: Insufficient documentation

## 2015-12-12 DIAGNOSIS — S20212D Contusion of left front wall of thorax, subsequent encounter: Secondary | ICD-10-CM | POA: Insufficient documentation

## 2015-12-12 DIAGNOSIS — S29001D Unspecified injury of muscle and tendon of front wall of thorax, subsequent encounter: Secondary | ICD-10-CM | POA: Diagnosis present

## 2015-12-12 DIAGNOSIS — Z8669 Personal history of other diseases of the nervous system and sense organs: Secondary | ICD-10-CM | POA: Diagnosis not present

## 2015-12-12 LAB — BASIC METABOLIC PANEL
Anion gap: 12 (ref 5–15)
BUN: 17 mg/dL (ref 6–20)
CALCIUM: 9.9 mg/dL (ref 8.9–10.3)
CO2: 25 mmol/L (ref 22–32)
Chloride: 101 mmol/L (ref 101–111)
Creatinine, Ser: 0.73 mg/dL (ref 0.44–1.00)
GFR calc Af Amer: >60 mL/min (ref >60)
GFR calc non Af Amer: >60 mL/min (ref >60)
Glucose, Bld: 90 mg/dL (ref 65–99)
Potassium: 3.3 mmol/L — ABNORMAL LOW (ref 3.5–5.1)
SODIUM: 138 mmol/L (ref 135–145)

## 2015-12-12 LAB — I-STAT CHEM 8, ED
BUN: 20 mg/dL (ref 6–20)
CHLORIDE: 101 mmol/L (ref 101–111)
Calcium, Ion: 1.26 mmol/L — ABNORMAL HIGH (ref 1.12–1.23)
Creatinine, Ser: 0.7 mg/dL (ref 0.44–1.00)
Glucose, Bld: 90 mg/dL (ref 65–99)
HEMATOCRIT: 46 % (ref 36.0–46.0)
Hemoglobin: 15.6 g/dL — ABNORMAL HIGH (ref 12.0–15.0)
POTASSIUM: 3.3 mmol/L — AB (ref 3.5–5.1)
SODIUM: 140 mmol/L (ref 135–145)
TCO2: 25 mmol/L (ref 0–100)

## 2015-12-12 LAB — CBC WITH DIFFERENTIAL/PLATELET
BASOS PCT: 0 %
Basophils Absolute: 0 10*3/uL (ref 0.0–0.1)
Eosinophils Absolute: 0.2 10*3/uL (ref 0.0–0.7)
Eosinophils Relative: 1 %
HEMATOCRIT: 43.4 % (ref 36.0–46.0)
HEMOGLOBIN: 14.6 g/dL (ref 12.0–15.0)
LYMPHS ABS: 5 10*3/uL — AB (ref 0.7–4.0)
LYMPHS PCT: 33 %
MCH: 30.6 pg (ref 26.0–34.0)
MCHC: 33.6 g/dL (ref 30.0–36.0)
MCV: 91 fL (ref 78.0–100.0)
MONOS PCT: 9 %
Monocytes Absolute: 1.3 10*3/uL — ABNORMAL HIGH (ref 0.1–1.0)
NEUTROS ABS: 8.8 10*3/uL — AB (ref 1.7–7.7)
NEUTROS PCT: 57 %
Platelets: 314 10*3/uL (ref 150–400)
RBC: 4.77 MIL/uL (ref 3.87–5.11)
RDW: 14.8 % (ref 11.5–15.5)
WBC: 15.2 10*3/uL — ABNORMAL HIGH (ref 4.0–10.5)

## 2015-12-12 LAB — TROPONIN I: Troponin I: <0.03 ng/mL (ref <0.031)

## 2015-12-12 LAB — I-STAT TROPONIN, ED: TROPONIN I, POC: 0 ng/mL (ref 0.00–0.08)

## 2015-12-12 LAB — BRAIN NATRIURETIC PEPTIDE: B NATRIURETIC PEPTIDE 5: 25.7 pg/mL (ref 0.0–100.0)

## 2015-12-12 MED ORDER — LORAZEPAM 1 MG PO TABS: 1.0000 mg | ORAL_TABLET | Freq: Once | ORAL | Status: DC

## 2015-12-12 MED ORDER — IOHEXOL 350 MG/ML SOLN
100.0000 mL | Freq: Once | INTRAVENOUS | Status: AC | PRN
  Administered 2015-12-12: 100 mL via INTRAVENOUS

## 2015-12-12 MED ORDER — SODIUM CHLORIDE 0.9 % IV BOLUS (SEPSIS)
1000.0000 mL | Freq: Once | INTRAVENOUS | Status: AC
  Administered 2015-12-12: 1000 mL via INTRAVENOUS

## 2015-12-12 NOTE — Discharge Instructions (Signed)
Contusion There is no evidence of heart attack, blood clot in the lung, broken rib or Bruce lung. Follow-up with your doctor. He should also see a cardiologist to have a stress test. Return to the ED if you develop new or worsening symptoms. A contusion is a deep bruise. Contusions are the result of a blunt injury to tissues and muscle fibers under the skin. The injury causes bleeding under the skin. The skin overlying the contusion may turn blue, purple, or yellow. Minor injuries will give you a painless contusion, but more severe contusions may stay painful and swollen for a few weeks.  CAUSES  This condition is usually caused by a blow, trauma, or direct force to an area of the body. SYMPTOMS  Symptoms of this condition include:  Swelling of the injured area.  Pain and tenderness in the injured area.  Discoloration. The area may have redness and then turn blue, purple, or yellow. DIAGNOSIS  This condition is diagnosed based on a physical exam and medical history. An X-ray, CT scan, or MRI may be needed to determine if there are any associated injuries, such as broken bones (fractures). TREATMENT  Specific treatment for this condition depends on what area of the body was injured. In general, the best treatment for a contusion is resting, icing, applying pressure to (compression), and elevating the injured area. This is often called the RICE strategy. Over-the-counter anti-inflammatory medicines may also be recommended for pain control.  HOME CARE INSTRUCTIONS   Rest the injured area.  If directed, apply ice to the injured area:  Put ice in a plastic bag.  Place a towel between your skin and the bag.  Leave the ice on for 20 minutes, 2-3 times per day.  If directed, apply light compression to the injured area using an elastic bandage. Make sure the bandage is not wrapped too tightly. Remove and reapply the bandage as directed by your health care provider.  If possible, raise (elevate)  the injured area above the level of your heart while you are sitting or lying down.  Take over-the-counter and prescription medicines only as told by your health care provider. SEEK MEDICAL CARE IF:  Your symptoms do not improve after several days of treatment.  Your symptoms get worse.  You have difficulty moving the injured area. SEEK IMMEDIATE MEDICAL CARE IF:   You have severe pain.  You have numbness in a hand or foot.  Your hand or foot turns pale or cold.   This information is not intended to replace advice given to you by your health care provider. Make sure you discuss any questions you have with your health care provider.   Document Released: 07/29/2005 Document Revised: 07/10/2015 Document Reviewed: 03/06/2015 Elsevier Interactive Patient Education [employer].
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
## 2015-12-12 NOTE — ED Notes (Signed)
Pt transported to xray 

## 2015-12-12 NOTE — ED Notes (Signed)
Phlebotomy at the bedside  
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
## 2015-12-12 NOTE — ED Notes (Signed)
Called CT to verify they would come get patient

## 2015-12-12 NOTE — ED Notes (Signed)
PT returned from CT
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
## 2015-12-12 NOTE — ED Notes (Signed)
Pt returned from X-ray.  

## 2016-01-24 ENCOUNTER — Other Ambulatory Visit: Payer: Self-pay | Admitting: Neurosurgery

## 2016-01-24 DIAGNOSIS — M542 Cervicalgia: Secondary | ICD-10-CM

## 2016-02-02 ENCOUNTER — Ambulatory Visit
Admission: RE | Admit: 2016-02-02 | Discharge: 2016-02-02 | Disposition: A | Discharge status: Home / Self Care | Payer: Managed Care, Other (non HMO) | Source: Ambulatory Visit | Attending: Neurosurgery | Admitting: Neurosurgery

## 2016-02-02 DIAGNOSIS — M542 Cervicalgia: Secondary | ICD-10-CM

## 2016-02-10 ENCOUNTER — Ambulatory Visit (INDEPENDENT_AMBULATORY_CARE_PROVIDER_SITE_OTHER): Payer: Managed Care, Other (non HMO) | Admitting: Licensed Clinical Social Worker

## 2016-02-10 DIAGNOSIS — F4310 Post-traumatic stress disorder, unspecified: Secondary | ICD-10-CM | POA: Diagnosis not present

## 2016-02-19 ENCOUNTER — Ambulatory Visit (INDEPENDENT_AMBULATORY_CARE_PROVIDER_SITE_OTHER): Payer: Managed Care, Other (non HMO) | Admitting: Licensed Clinical Social Worker

## 2016-02-19 DIAGNOSIS — F4310 Post-traumatic stress disorder, unspecified: Secondary | ICD-10-CM | POA: Diagnosis not present

## 2016-02-25 ENCOUNTER — Ambulatory Visit (INDEPENDENT_AMBULATORY_CARE_PROVIDER_SITE_OTHER): Payer: Managed Care, Other (non HMO) | Admitting: Licensed Clinical Social Worker

## 2016-02-25 DIAGNOSIS — F4310 Post-traumatic stress disorder, unspecified: Secondary | ICD-10-CM | POA: Diagnosis not present

## 2016-03-18 ENCOUNTER — Ambulatory Visit (INDEPENDENT_AMBULATORY_CARE_PROVIDER_SITE_OTHER): Payer: Managed Care, Other (non HMO) | Admitting: Licensed Clinical Social Worker

## 2016-03-18 DIAGNOSIS — F4310 Post-traumatic stress disorder, unspecified: Secondary | ICD-10-CM | POA: Diagnosis not present

## 2016-04-15 ENCOUNTER — Ambulatory Visit: Payer: Managed Care, Other (non HMO) | Admitting: Obstetrics & Gynecology

## 2016-04-15 DIAGNOSIS — R102 Pelvic and perineal pain: Secondary | ICD-10-CM

## 2016-06-17 ENCOUNTER — Encounter: Payer: Self-pay | Admitting: Rehabilitative and Restorative Service Providers"

## 2016-06-17 ENCOUNTER — Encounter (INDEPENDENT_AMBULATORY_CARE_PROVIDER_SITE_OTHER): Payer: Self-pay

## 2016-06-17 ENCOUNTER — Ambulatory Visit (INDEPENDENT_AMBULATORY_CARE_PROVIDER_SITE_OTHER): Payer: Managed Care, Other (non HMO) | Admitting: Rehabilitative and Restorative Service Providers"

## 2016-06-17 DIAGNOSIS — R293 Abnormal posture: Secondary | ICD-10-CM

## 2016-06-17 DIAGNOSIS — M542 Cervicalgia: Secondary | ICD-10-CM | POA: Diagnosis not present

## 2016-06-17 DIAGNOSIS — R29898 Other symptoms and signs involving the musculoskeletal system: Secondary | ICD-10-CM

## 2016-06-17 NOTE — Patient Instructions (Signed)
Axial Extension (Chin Tuck)    Pull chin in and lengthen back of neck. Hold __10-15__ seconds while counting out loud. Repeat __5__ times. Do _several___ sessions per day.  Shoulder Blade Squeeze    Rotate shoulders back, then squeeze shoulder blades down and back. Hold 10 sec Repeat _10___ times. Do _3-4 ___ sessions per day.   Self massage using a 4 inch rubber ball  NO RECLINER   TENS UNIT: This is helpful for muscle pain and spasm.   Search and Purchase a TENS 7000 2nd edition at www.tenspros.com. It should be less than $30.     TENS unit instructions: Do not shower or bathe with the unit on Turn the unit off before removing electrodes or batteries If the electrodes lose stickiness add a drop of water to the electrodes after they are disconnected from the unit and place on plastic sheet. If you continued to have difficulty, call the TENS unit company to purchase more electrodes. Do not apply lotion on the skin area prior to use. Make sure the skin is clean and dry as this will help prolong the life of the electrodes. After use, always check skin for unusual red areas, rash or other skin difficulties. If there are any skin problems, does not apply electrodes to the same area. Never remove the electrodes from the unit by pulling the wires. Do not use the TENS unit or electrodes other than as directed. Do not change electrode placement without consultating your therapist or physician. Keep 2 fingers with between each electrode.
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
## 2016-06-19 ENCOUNTER — Encounter: Payer: Self-pay | Admitting: Physical Therapy

## 2016-06-19 ENCOUNTER — Ambulatory Visit (INDEPENDENT_AMBULATORY_CARE_PROVIDER_SITE_OTHER): Payer: Managed Care, Other (non HMO) | Admitting: Physical Therapy

## 2016-06-19 DIAGNOSIS — R29898 Other symptoms and signs involving the musculoskeletal system: Secondary | ICD-10-CM | POA: Diagnosis not present

## 2016-06-19 DIAGNOSIS — M542 Cervicalgia: Secondary | ICD-10-CM

## 2016-06-19 DIAGNOSIS — R293 Abnormal posture: Secondary | ICD-10-CM

## 2016-06-19 NOTE — Therapy (Signed)
[place], [place], [address] Phone: [phone]   Fax:  [phone]  Physical Therapy Treatment  Patient Details  Name: [name] Date of Birth: May 10, 1966 Referring Provider: Dr. Kristeen Miss  Encounter Date: 06/19/2016      PT End of Session - 06/19/16 1059    Visit Number 2   Number of Visits 12   Date for PT Re-Evaluation 07/29/16   PT Start Time 1059   PT Stop Time 1205   PT Time Calculation (min) 66 min   Activity Tolerance Patient tolerated treatment well      Past Medical History:  Diagnosis Date  . Cancer ([place]) 1990   Cervical  . Carpal tunnel syndrome, bilateral   . Complication of anesthesia INTRAOP HYPERTENSION W/ TONSILLECTOMY SURGERY AGE 51--  NO ISSUES SINCE  . Left knee pain     Past Surgical History:  Procedure Laterality Date  . ANTERIOR CERVICAL DECOMP/DISCECTOMY FUSION  10-16-2010   C4  -  C6  . CERVICAL BIOPSY  W/ LOOP ELECTRODE EXCISION  2010  . CESAREAN SECTION  1987  . CHOLECYSTECTOMY  2009  . FOOT SURGERY  2010   LEFT  . KNEE ARTHROSCOPY  10/20/2012   Procedure: ARTHROSCOPY KNEE;  Surgeon: Mauri Pole, MD;  Location: [facility];  Service: Orthopedics;  Laterality: Left;  medial menisectomy medial patellar condroplasty and sinovectomy  . TONSILLECTOMY  AGE 51  . TRANSTHORACIC ECHOCARDIOGRAM  06-20-2010   NORMAL LVSF/ EF 55%  . TUBAL LIGATION  1988    There were no vitals filed for this visit.      Subjective Assessment - 06/19/16 1101    Subjective Pt still very limited cervical movement. She hasn't done a lot of her HEP due to pain, she did order her TENs unit.    Currently in Pain? Yes   Pain Score 9    Pain Location Neck   Pain Orientation Mid   Pain Descriptors / Indicators Heaviness   Pain Type Chronic pain                         [facility] Adult PT Treatment/Exercise - 06/19/16 0001      Exercises   Exercises Neck     Neck Exercises: Supine   Cervical Isometrics Extension;3 secs;10 reps  into a towel   Other Supine Exercise 10 reps, overhead pull with red band in painfree ROM   Other Supine Exercise 10 reps shoulder presses, thoracic lifts.      Modalities   Modalities Electrical Stimulation;Moist Heat     Moist Heat Therapy   Number Minutes Moist Heat 20 Minutes   Moist Heat Location Cervical;Shoulder     Electrical Stimulation   Electrical Stimulation Location bilat cervical Rt shoulder    Electrical Stimulation Action IFC   Electrical Stimulation Parameters to tolerance   Electrical Stimulation Goals Pain;Tone     Manual Therapy   Manual Therapy Soft tissue mobilization;Manual Traction   Soft tissue mobilization cervical paraspinals, upper traps levators    Manual Traction cervical light pull                     PT Long Term Goals - 06/17/16 1240      PT LONG TERM GOAL #1   Title Improve posture and alignment allowing patient to demonstrate upright posture with good position of head/neck/shoulders 07/29/16   Time  6   Period Weeks   Status New     PT LONG TERM GOAL #2   Title Imrprove cervical and shoulder ROM to WFL's throughtout 07/29/16   Time 6   Period Weeks   Status New     PT LONG TERM GOAL #3   Title Patient reports that she is able to sleep for 6 or more hours per night 07/29/16   Time 6   Period Weeks   Status New     PT LONG TERM GOAL #4   Title Independent in HEP 07/29/16   Time 6   Period Weeks   Status New     PT LONG TERM GOAL #5   Title Improve FOTO to </= 54% limitation 07/29/16   Time 6   Period Weeks   Status New               Plan - 06/19/16 1151    Clinical Impression Statement This is only Zenna's second visit, no goals met, continues to have a lot of guarding and pain.    Rehab Potential Good   PT Frequency 2x / week   PT Duration 6 weeks   PT Treatment/Interventions Patient/family  education;ADLs/Self Care Home Management;Neuromuscular re-education;Cryotherapy;Electrical Stimulation;Iontophoresis 44m/ml Dexamethasone;Moist Heat;Traction;Ultrasound;Manual techniques;Dry needling;Therapeutic activities;Therapeutic exercise   PT Next Visit Plan continue postural correctin; introduction of movement; ther ex; manual therapy as indicated; modalities as indicated    Consulted and Agree with Plan of Care Patient      Patient will benefit from skilled therapeutic intervention in order to improve the following deficits and impairments:  Postural dysfunction, Improper body mechanics, Pain, Decreased range of motion, Decreased mobility, Increased fascial restricitons, Increased muscle spasms, Decreased activity tolerance  Visit Diagnosis: Abnormal posture  Other symptoms and signs involving the musculoskeletal system  Cervicalgia     Problem List Patient Active Problem List   Diagnosis Date Noted  . S/P left knee arthroscopy 10/20/2012    SJeral PinchPT  06/19/2016, 11:52 AM  [facility] [address]Phone: [phone]  Fax:  [phone] Name: [name]

## 2016-06-22 ENCOUNTER — Encounter: Payer: Self-pay | Admitting: Physical Therapy

## 2016-06-23 ENCOUNTER — Ambulatory Visit (INDEPENDENT_AMBULATORY_CARE_PROVIDER_SITE_OTHER): Payer: Managed Care, Other (non HMO) | Admitting: Physical Therapy

## 2016-06-23 DIAGNOSIS — R293 Abnormal posture: Secondary | ICD-10-CM | POA: Diagnosis not present

## 2016-06-23 DIAGNOSIS — R29898 Other symptoms and signs involving the musculoskeletal system: Secondary | ICD-10-CM

## 2016-06-23 DIAGNOSIS — M542 Cervicalgia: Secondary | ICD-10-CM | POA: Diagnosis not present

## 2016-06-23 NOTE — Patient Instructions (Signed)
[facility] [place]: Single Arm    Arms near sides, palms up. Press one arm lightly into floor, slide arm out to side and up alongside head. Keep contact with bed throughout motion. At maximal position, lengthen arm. Hold _2-3__ seconds. Relax. Repeat __10_ times. Slide arm back to start. Repeat with other arm.  SHOULDER: Flexion - Supine    Raise one arm overhead with palm up. _10__ reps per set, _1-2__ sets per day, __4_ days per week Raise both arms at same speed. Alternate raising arms, left then right.  Copyright  VHI. All rights reserved.   *  Chin nod 5 reps, 5 x.  Each side and to the front.
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
## 2016-06-25 ENCOUNTER — Encounter: Payer: Self-pay | Admitting: Physical Therapy

## 2016-06-26 ENCOUNTER — Ambulatory Visit (INDEPENDENT_AMBULATORY_CARE_PROVIDER_SITE_OTHER): Payer: Managed Care, Other (non HMO) | Admitting: Physical Therapy

## 2016-06-26 DIAGNOSIS — R293 Abnormal posture: Secondary | ICD-10-CM | POA: Diagnosis not present

## 2016-06-26 DIAGNOSIS — R29898 Other symptoms and signs involving the musculoskeletal system: Secondary | ICD-10-CM | POA: Diagnosis not present

## 2016-06-26 DIAGNOSIS — M542 Cervicalgia: Secondary | ICD-10-CM | POA: Diagnosis not present

## 2016-06-26 NOTE — Therapy (Signed)
[place], [place], [address] Phone: [phone]   Fax:  [phone]  Physical Therapy Treatment  Patient Details  Name: [name] MRN: [id number] Date of Birth: 1966/08/21 Referring Provider: Dr. Kristeen Miss   Encounter Date: 06/26/2016      PT End of Session - 06/26/16 0810    Visit Number 4   Number of Visits 12   Date for PT Re-Evaluation 07/29/16   PT Start Time 0807   PT Stop Time 0903   PT Time Calculation (min) 56 min      Past Medical History:  Diagnosis Date  . Cancer ([place]) 1990   Cervical  . Carpal tunnel syndrome, bilateral   . Complication of anesthesia INTRAOP HYPERTENSION W/ TONSILLECTOMY SURGERY AGE 82--  NO ISSUES SINCE  . Left knee pain     Past Surgical History:  Procedure Laterality Date  . ANTERIOR CERVICAL DECOMP/DISCECTOMY FUSION  10-16-2010   C4  -  C6  . CERVICAL BIOPSY  W/ LOOP ELECTRODE EXCISION  2010  . CESAREAN SECTION  1987  . CHOLECYSTECTOMY  2009  . FOOT SURGERY  2010   LEFT  . KNEE ARTHROSCOPY  10/20/2012   Procedure: ARTHROSCOPY KNEE;  Surgeon: Mauri Pole, MD;  Location: [facility];  Service: Orthopedics;  Laterality: Left;  medial menisectomy medial patellar condroplasty and sinovectomy  . TONSILLECTOMY  AGE 82  . TRANSTHORACIC ECHOCARDIOGRAM  06-20-2010   NORMAL LVSF/ EF 55%  . TUBAL LIGATION  1988    There were no vitals filed for this visit.      Subjective Assessment - 06/26/16 0810    Subjective Pt reports that by the time she got home after last 2 visits, her pain in her neck was a 20/10. Had to take muscle relaxer and pain pill to ease the pain. She has been avoiding the recliner.     Currently in Pain? Yes   Pain Score 6    Pain Location Neck   Pain Orientation Right;Left;Mid   Pain Radiating Towards down Rt arm to fingers    Aggravating Factors  sitting, driving   Pain Relieving Factors medication, laying  down.             [facility] PT Assessment - 06/26/16 0001      Assessment   Medical Diagnosis Cervical disc fusion C3/4   Referring Provider Dr. Kristeen Miss    Onset Date/Surgical Date 03/20/16   Hand Dominance Right   Next MD Visit 07/23/16   Prior Therapy none for current problem     AROM   Overall AROM Comments pain at available end range   Cervical Flexion 22   Cervical Extension 19   Cervical - Right Side Bend 18   Cervical - Left Side Bend 15   Cervical - Right Rotation 25   Cervical - Left Rotation 20          [facility] Adult PT Treatment/Exercise - 06/26/16 0001      Neck Exercises: Supine   Neck Retraction 10 reps;5 secs   Other Supine Exercise Chin nods 5 reps, x 5 sets; repeated at 10 deg cervical rotation.    Other Supine Exercise 10 reps scap squeeze x 5 sec x 10 reps      Manual Therapy   Manual Therapy Soft tissue mobilization;Manual Traction;Myofascial release   Manual therapy comments pt prone - forehead supported on pillow    Soft tissue mobilization cervical  paraspinals, upper traps levators      Neck Exercises: Stretches   Other Neck Stretches lateral neck flexion x 15 sec x 3 reps each side, to tolerance           Trigger Point Dry Needling - 06/26/16 0847    Consent Given? Yes   Education Handout Provided Yes   Muscles Treated Upper Body Upper trapezius;Suboccipitals muscle group;Longissimus  bilat    Upper Trapezius Response Twitch reponse elicited;Palpable increased muscle length   SubOccipitals Response Twitch response elicited;Palpable increased muscle length   Longissimus Response Twitch response elicited;Palpable increased muscle length              PT Education - 06/26/16 0848    Education provided Yes   Education Details TDN   Person(s) Educated Patient   Methods Explanation   Comprehension Verbalized understanding             PT Long Term Goals - 06/23/16 1238      PT LONG TERM GOAL #1   Title Improve posture and  alignment allowing patient to demonstrate upright posture with good position of head/neck/shoulders 07/29/16   Time 6   Period Weeks   Status On-going     PT LONG TERM GOAL #2   Title Imrprove cervical and shoulder ROM to WFL's throughtout 07/29/16   Time 6   Period Weeks   Status On-going     PT LONG TERM GOAL #3   Title Patient reports that she is able to sleep for 6 or more hours per night 07/29/16   Time 6   Period Weeks   Status On-going     PT LONG TERM GOAL #4   Title Independent in HEP 07/29/16   Time 6   Period Weeks   Status On-going     PT LONG TERM GOAL #5   Title Improve FOTO to </= 54% limitation 07/29/16   Time 6   Period Weeks   Status On-going               Plan - 06/26/16 [id number]    Clinical Impression Statement Pt's cervical ROM has decreased since initial visit. She tolerated the gentle cervical exercises without increased pain today.  Pt verbalized understanding on TENS use and application. Patient tolerated TDN well with good release of muscular tightness noted through the cervical and upper trap musculature.    Rehab Potential Good   PT Frequency 2x / week   PT Duration 6 weeks   PT Treatment/Interventions Patient/family education;ADLs/Self Care Home Management;Neuromuscular re-education;Cryotherapy;Electrical Stimulation;Iontophoresis 4mg /ml Dexamethasone;Moist Heat;Traction;Ultrasound;Manual techniques;Dry needling;Therapeutic activities;Therapeutic exercise   PT Next Visit Plan Assess response to TDN.        Patient will benefit from skilled therapeutic intervention in order to improve the following deficits and impairments:  Postural dysfunction, Improper body mechanics, Pain, Decreased range of motion, Decreased mobility, Increased fascial restricitons, Increased muscle spasms, Decreased activity tolerance  Visit Diagnosis: Abnormal posture  Other symptoms and signs involving the musculoskeletal system  Cervicalgia     Problem  List Patient Active Problem List   Diagnosis Date Noted  . S/P left knee arthroscopy 10/20/2012   Kerin Perna, PTA 06/26/16 8:50 AM  Celyn P. Helene Kelp PT, MPH 06/26/16 10:48 AM    [place], [place], [address] Phone: [phone]   Fax:  [phone]  Name: [name] MRN: [id number] Date of Birth: 1966-04-29

## 2016-06-26 NOTE — Patient Instructions (Signed)

## 2016-06-29 ENCOUNTER — Ambulatory Visit (INDEPENDENT_AMBULATORY_CARE_PROVIDER_SITE_OTHER): Payer: Managed Care, Other (non HMO) | Admitting: Physical Therapy

## 2016-06-29 DIAGNOSIS — R293 Abnormal posture: Secondary | ICD-10-CM | POA: Diagnosis not present

## 2016-06-29 DIAGNOSIS — R29898 Other symptoms and signs involving the musculoskeletal system: Secondary | ICD-10-CM | POA: Diagnosis not present

## 2016-06-29 DIAGNOSIS — M542 Cervicalgia: Secondary | ICD-10-CM

## 2016-06-29 NOTE — Therapy (Signed)
[place], [place], [address] Phone: [phone]   Fax:  [phone]  Physical Therapy Treatment  Patient Details  Name: [name] MRN: [id number] Date of Birth: 04-Oct-1966 Referring Provider: Dr. Kristeen Miss   Encounter Date: 06/29/2016      PT End of Session - 06/29/16 0854    Visit Number 5   Number of Visits 12   Date for PT Re-Evaluation 07/29/16   PT Start Time 0846   PT Stop Time 0946   PT Time Calculation (min) 60 min      Past Medical History:  Diagnosis Date  . Cancer ([place]) 1990   Cervical  . Carpal tunnel syndrome, bilateral   . Complication of anesthesia INTRAOP HYPERTENSION W/ TONSILLECTOMY SURGERY AGE 3--  NO ISSUES SINCE  . Left knee pain     Past Surgical History:  Procedure Laterality Date  . ANTERIOR CERVICAL DECOMP/DISCECTOMY FUSION  10-16-2010   C4  -  C6  . CERVICAL BIOPSY  W/ LOOP ELECTRODE EXCISION  2010  . CESAREAN SECTION  1987  . CHOLECYSTECTOMY  2009  . FOOT SURGERY  2010   LEFT  . KNEE ARTHROSCOPY  10/20/2012   Procedure: ARTHROSCOPY KNEE;  Surgeon: Mauri Pole, MD;  Location: [facility];  Service: Orthopedics;  Laterality: Left;  medial menisectomy medial patellar condroplasty and sinovectomy  . TONSILLECTOMY  AGE 3  . TRANSTHORACIC ECHOCARDIOGRAM  06-20-2010   NORMAL LVSF/ EF 55%  . TUBAL LIGATION  1988    There were no vitals filed for this visit.      Subjective Assessment - 06/29/16 0850    Subjective Pt reports she slept for the first time throught night for last 3 nights.  Her pain has significantly reduced just after needling.  "It no longer felt like an elephant on my neck"  She states her hearing has improved as well.    Currently in Pain? Yes   Pain Score 2    Pain Location Neck   Pain Orientation Right;Left;Mid   Pain Radiating Towards down Rt arm to fingers (but no longer constant)    Aggravating Factors   sitting unsupported, driving   Pain Relieving Factors medication, TDN, laying down.             [facility] PT Assessment - 06/29/16 0001      Assessment   Medical Diagnosis Cervical disc fusion C3/4   Referring Provider Dr. Kristeen Miss    Onset Date/Surgical Date 03/20/16   Hand Dominance Right   Next MD Visit 07/23/16          [facility] Adult PT Treatment/Exercise - 06/29/16 0001      Self-Care   Self-Care Other Self-Care Comments   Other Self-Care Comments  Pt educated on technique for self MFR to platysma (rt/lt). Pt returned demo.      Neck Exercises: Seated   Cervical Isometrics Right rotation;Left rotation  5 nods, x 5 reps each side    Neck Retraction 10 reps;5 secs   Other Seated Exercise chin nods with head in neutral x 10 reps.       Neck Exercises: Supine   Shoulder Flexion Right;Left;5 reps   Upper Extremity D1 Flexion;5 reps;Theraband  to tolerance, 2 sets   Theraband Level (UE D1) Level 1 (Yellow)   Other Supine Exercise bilat horiz abdct (yellow band) x 5 reps x 2 sets   Other Supine Exercise unilateral snow angel arm to  tolerance x 10 reps     Moist Heat Therapy   Number Minutes Moist Heat 20 Minutes   Moist Heat Location Shoulder;Cervical     Electrical Stimulation   Electrical Stimulation Location bilat cervical paraspinals    Electrical Stimulation Action IFC   Electrical Stimulation Parameters to tolerance    Electrical Stimulation Goals Pain;Tone     Manual Therapy   Manual Therapy Taping;Myofascial release;Soft tissue mobilization   Manual therapy comments I strip applied to Rt pec at clavical and Rt upper trap to inhibit muscle and decrease pain/ sensitivity.    Soft tissue mobilization cervical paraspinals, upper traps levators    Myofascial Release Rt/Lt platysma      Neck Exercises: Stretches   Other Neck Stretches lateral neck flexion x 15 sec x 2 reps each side, to tolerance                      PT Long Term Goals - 06/23/16  1238      PT LONG TERM GOAL #1   Title Improve posture and alignment allowing patient to demonstrate upright posture with good position of head/neck/shoulders 07/29/16   Time 6   Period Weeks   Status On-going     PT LONG TERM GOAL #2   Title Imrprove cervical and shoulder ROM to WFL's throughtout 07/29/16   Time 6   Period Weeks   Status On-going     PT LONG TERM GOAL #3   Title Patient reports that she is able to sleep for 6 or more hours per night 07/29/16   Time 6   Period Weeks   Status On-going     PT LONG TERM GOAL #4   Title Independent in HEP 07/29/16   Time 6   Period Weeks   Status On-going     PT LONG TERM GOAL #5   Title Improve FOTO to </= 54% limitation 07/29/16   Time 6   Period Weeks   Status On-going               Plan - 06/29/16 1334    Clinical Impression Statement Pt had positive response to TDN and was able to sleep longer than 6 hours for 3 straight days (LTG#3).  Pt able to better tolerate exercise, as well as a little resistance for UE, without increase in symptoms.  Pt making gains towards established goals.    Rehab Potential Good   PT Frequency 2x / week   PT Duration 6 weeks   PT Treatment/Interventions Patient/family education;ADLs/Self Care Home Management;Neuromuscular re-education;Cryotherapy;Electrical Stimulation;Iontophoresis 4mg /ml Dexamethasone;Moist Heat;Traction;Ultrasound;Manual techniques;Dry needling;Therapeutic activities;Therapeutic exercise   PT Next Visit Plan continue progressive postural strengthening, neck ROM.  TDN vs manual therapy.    Consulted and Agree with Plan of Care Patient      Patient will benefit from skilled therapeutic intervention in order to improve the following deficits and impairments:  Postural dysfunction, Improper body mechanics, Pain, Decreased range of motion, Decreased mobility, Increased fascial restricitons, Increased muscle spasms, Decreased activity tolerance  Visit Diagnosis: Abnormal  posture  Other symptoms and signs involving the musculoskeletal system  Cervicalgia     Problem List Patient Active Problem List   Diagnosis Date Noted  . S/P left knee arthroscopy 10/20/2012   Kerin Perna, PTA 06/29/16 1:38 PM  [facility] [place], [place], [address] Phone: [phone]   Fax:  [phone]  Name: [name] MRN: [id number] Date of  Birth: [birth year]

## 2016-07-03 ENCOUNTER — Encounter: Payer: Self-pay | Admitting: Rehabilitative and Restorative Service Providers"

## 2016-07-03 ENCOUNTER — Ambulatory Visit (INDEPENDENT_AMBULATORY_CARE_PROVIDER_SITE_OTHER): Payer: Managed Care, Other (non HMO) | Admitting: Rehabilitative and Restorative Service Providers"

## 2016-07-03 DIAGNOSIS — M542 Cervicalgia: Secondary | ICD-10-CM

## 2016-07-03 DIAGNOSIS — R293 Abnormal posture: Secondary | ICD-10-CM

## 2016-07-03 DIAGNOSIS — R29898 Other symptoms and signs involving the musculoskeletal system: Secondary | ICD-10-CM

## 2016-07-03 NOTE — Patient Instructions (Signed)
Scapula Adduction With Pectoralis Stretch: Low - Standing   Shoulders at 45 hands even with shoulders, keeping weight through legs, shift weight forward until you feel pull or stretch through the front of your chest. Hold _30__ seconds. Do _3__ times, _2-4__ times per day.   Scapula Adduction With Pectoralis Stretch: Mid-Range - Standing   Shoulders at 90 elbows even with shoulders, keeping weight through legs, shift weight forward until you feel pull or strength through the front of your chest. Hold __30_ seconds. Do _3__ times, __2-4_ times per day.   Scapula Adduction With Pectoralis Stretch: High - Standing   Shoulders at 120 hands up high on the doorway, keeping weight on feet, shift weight forward until you feel pull or stretch through the front of your chest. Hold _30__ seconds. Do _3__ times, _2-3__ times per day.  

## 2016-07-03 NOTE — Therapy (Signed)
[place], [place], [address] Phone: [phone]   Fax:  [phone]  Physical Therapy Treatment  Patient Details  Name: KITRA ARZT MRN: [id number] Date of Birth: 02/06/1966 Referring Provider: Dr. Kristeen Miss   Encounter Date: 07/03/2016      PT End of Session - 07/03/16 0808    Visit Number 6   Number of Visits 12   Date for PT Re-Evaluation 07/29/16   PT Start Time 0808   PT Stop Time 0903   PT Time Calculation (min) 55 min   Activity Tolerance Patient tolerated treatment well      Past Medical History:  Diagnosis Date  . Cancer ([place]) 1990   Cervical  . Carpal tunnel syndrome, bilateral   . Complication of anesthesia INTRAOP HYPERTENSION W/ TONSILLECTOMY SURGERY AGE 50--  NO ISSUES SINCE  . Left knee pain     Past Surgical History:  Procedure Laterality Date  . ANTERIOR CERVICAL DECOMP/DISCECTOMY FUSION  10-16-2010   C4  -  C6  . CERVICAL BIOPSY  W/ LOOP ELECTRODE EXCISION  2010  . CESAREAN SECTION  1987  . CHOLECYSTECTOMY  2009  . FOOT SURGERY  2010   LEFT  . KNEE ARTHROSCOPY  10/20/2012   Procedure: ARTHROSCOPY KNEE;  Surgeon: Mauri Pole, MD;  Location: [facility];  Service: Orthopedics;  Laterality: Left;  medial menisectomy medial patellar condroplasty and sinovectomy  . TONSILLECTOMY  AGE 50  . TRANSTHORACIC ECHOCARDIOGRAM  06-20-2010   NORMAL LVSF/ EF 55%  . TUBAL LIGATION  1988    There were no vitals filed for this visit.      Subjective Assessment - 07/03/16 0809    Subjective Patient reports that she is gradually improving. She continues to have numbness in the Rt arm and hand. Good response to the TDN. It does not feel great but it really works.    Currently in Pain? Yes   Pain Score 5    Pain Location Neck   Pain Orientation Right;Left;Mid   Pain Descriptors / Indicators Heaviness   Pain Type Chronic pain   Pain Onset More than a month  ago   Pain Frequency Constant                         [facility] Adult PT Treatment/Exercise - 07/03/16 0001      Neck Exercises: Standing   Neck Retraction 5 reps;10 secs   Other Standing Exercises scap squeeze with noodle 10 sec x 5     Neck Exercises: Seated   Neck Retraction 10 reps;5 secs   Other Seated Exercise AROM turning head side to side and tipping head side to side      Neck Exercises: Supine   Neck Retraction 10 reps;5 secs     Moist Heat Therapy   Number Minutes Moist Heat 20 Minutes   Moist Heat Location Shoulder;Cervical     Electrical Stimulation   Electrical Stimulation Location bilat cervical paraspinals    Electrical Stimulation Action IFC   Electrical Stimulation Parameters to tolerance   Electrical Stimulation Goals Pain;Tone     Manual Therapy   Manual Therapy Taping;Myofascial release;Soft tissue mobilization   Manual therapy comments I strip applied to Rt pec at clavical and Rt upper trap to inhibit muscle and decrease pain/ sensitivity.    Soft tissue mobilization cervical paraspinals; upper trap; leveator    Myofascial Release cervical and posterior shoulder/trap area  Neck Exercises: Stretches   Other Neck Stretches 3 way doorway stretch 2 reps 20 sec hold           Trigger Point Dry Needling - 07/03/16 0816    Consent Given? Yes   Upper Trapezius Response Twitch reponse elicited;Palpable increased muscle length   SubOccipitals Response Twitch response elicited;Palpable increased muscle length   Longissimus Response Twitch response elicited;Palpable increased muscle length              PT Education - 07/03/16 0841    Education provided Yes   Education Details HEP   Person(s) Educated Patient   Methods Explanation;Demonstration;Tactile cues;Verbal cues;Handout   Comprehension Verbalized understanding;Verbal cues required;Returned demonstration;Tactile cues required             PT Long Term Goals - 07/03/16  0842      PT LONG TERM GOAL #1   Title Improve posture and alignment allowing patient to demonstrate upright posture with good position of head/neck/shoulders 07/29/16   Time 6   Period Weeks   Status On-going     PT LONG TERM GOAL #2   Title Imrprove cervical and shoulder ROM to WFL's throughtout 07/29/16   Time 6   Period Weeks   Status On-going     PT LONG TERM GOAL #3   Title Patient reports that she is able to sleep for 6 or more hours per night 07/29/16   Time 6   Period Weeks   Status On-going     PT LONG TERM GOAL #4   Title Independent in HEP 07/29/16   Time 6   Period Weeks   Status On-going     PT LONG TERM GOAL #5   Title Improve FOTO to </= 54% limitation 07/29/16   Time 6   Period Weeks   Status On-going               Plan - 07/03/16 0841    Clinical Impression Statement Gradual improvement. Good response to TDN but only tolerates a few trigger points. Progressing gradually towards stated goals of therapy.    Rehab Potential Good   PT Frequency 2x / week   PT Duration 6 weeks   PT Treatment/Interventions Patient/family education;ADLs/Self Care Home Management;Neuromuscular re-education;Cryotherapy;Electrical Stimulation;Iontophoresis 4mg /ml Dexamethasone;Moist Heat;Traction;Ultrasound;Manual techniques;Dry needling;Therapeutic activities;Therapeutic exercise   PT Next Visit Plan continue progressive postural strengthening, neck ROM.  TDN vs manual therapy.    Consulted and Agree with Plan of Care Patient      Patient will benefit from skilled therapeutic intervention in order to improve the following deficits and impairments:  Postural dysfunction, Improper body mechanics, Pain, Decreased range of motion, Decreased mobility, Increased fascial restricitons, Increased muscle spasms, Decreased activity tolerance  Visit Diagnosis: Abnormal posture  Other symptoms and signs involving the musculoskeletal system  Cervicalgia     Problem List Patient  Active Problem List   Diagnosis Date Noted  . S/P left knee arthroscopy 10/20/2012    [name] PT, MPH  07/03/2016, 8:43 AM  [facility] [place], [place], [address] Phone: [phone]   Fax:  [phone]  Name: CHASITEE MATTHIESEN MRN: [id number] Date of Birth: 09/06/1966

## 2016-07-08 ENCOUNTER — Ambulatory Visit (INDEPENDENT_AMBULATORY_CARE_PROVIDER_SITE_OTHER): Payer: Managed Care, Other (non HMO) | Admitting: Rehabilitative and Restorative Service Providers"

## 2016-07-08 ENCOUNTER — Encounter: Payer: Self-pay | Admitting: Rehabilitative and Restorative Service Providers"

## 2016-07-08 DIAGNOSIS — M542 Cervicalgia: Secondary | ICD-10-CM | POA: Diagnosis not present

## 2016-07-08 DIAGNOSIS — R29898 Other symptoms and signs involving the musculoskeletal system: Secondary | ICD-10-CM

## 2016-07-08 DIAGNOSIS — R293 Abnormal posture: Secondary | ICD-10-CM

## 2016-07-08 NOTE — Patient Instructions (Signed)
Neurovascular: Median Nerve Stretch - Supine    Lie with neck supported, tuck chin, side-bent away from moving arm and turn head slightly away form moving arm.  Hold right arm out to side, elbow bent, thumb down, fingers and wrist bent back. Slowly straighten elbow as far as possible without pain.  Hold for __60__ seconds. Repeat __1_ times per set. Do _2 x/day   Gentle nodding yes/no while lying on back head supported comfortably. 10-20 reps

## 2016-07-08 NOTE — Therapy (Signed)
[place], [place], [address] Phone: [phone]   Fax:  [phone]  Physical Therapy Treatment  Patient Details  Name: [name] MRN: [id number] Date of Birth: 23-Apr-1966 Referring Provider: Dr. Kristeen Miss   Encounter Date: 07/08/2016      PT End of Session - 07/08/16 1108    Visit Number 7   Number of Visits 12   Date for PT Re-Evaluation 07/29/16   PT Start Time 1107   PT Stop Time 1200   PT Time Calculation (min) 53 min   Activity Tolerance Patient tolerated treatment well      Past Medical History:  Diagnosis Date  . Cancer ([place]) 1990   Cervical  . Carpal tunnel syndrome, bilateral   . Complication of anesthesia INTRAOP HYPERTENSION W/ TONSILLECTOMY SURGERY AGE 81--  NO ISSUES SINCE  . Left knee pain     Past Surgical History:  Procedure Laterality Date  . ANTERIOR CERVICAL DECOMP/DISCECTOMY FUSION  10-16-2010   C4  -  C6  . CERVICAL BIOPSY  W/ LOOP ELECTRODE EXCISION  2010  . CESAREAN SECTION  1987  . CHOLECYSTECTOMY  2009  . FOOT SURGERY  2010   LEFT  . KNEE ARTHROSCOPY  10/20/2012   Procedure: ARTHROSCOPY KNEE;  Surgeon: Mauri Pole, MD;  Location: [facility];  Service: Orthopedics;  Laterality: Left;  medial menisectomy medial patellar condroplasty and sinovectomy  . TONSILLECTOMY  AGE 81  . TRANSTHORACIC ECHOCARDIOGRAM  06-20-2010   NORMAL LVSF/ EF 55%  . TUBAL LIGATION  1988    There were no vitals filed for this visit.      Subjective Assessment - 07/08/16 1110    Subjective More sorenss following TDN last visit. Took about 2 days for symptoms to reslove. Better now and she has less difficulty sleeping . Generally feels she is making progress. Pain level is decreased. Rt arm and hand "goes numb" when gripping the steering wheel or writing.    Currently in Pain? Yes   Pain Score 4    Pain Location Neck   Pain Orientation Right;Left;Mid   Pain Descriptors / Indicators Heaviness   Pain Type Chronic pain   Pain Onset More than a month ago   Pain Frequency Constant                         [facility] Adult PT Treatment/Exercise - 07/08/16 0001      Neck Exercises: Supine   Neck Retraction 10 reps;5 secs   Other Supine Exercise neural mobilization 1 min x 2 each side      Moist Heat Therapy   Number Minutes Moist Heat 15 Minutes   Moist Heat Location Shoulder;Cervical     Electrical Stimulation   Electrical Stimulation Location bilat cervical paraspinals    Electrical Stimulation Action IFC   Electrical Stimulation Parameters to tolerance   Electrical Stimulation Goals Pain;Tone     Manual Therapy   Manual therapy comments patient supine    Soft tissue mobilization bilat cervical paraspinals; Rt upper trap; leveator; pecs; UE; wrist and hand   Myofascial Release cervical and pecs/Rt UE   Manual Traction gentle manual elongation through treatment      Neck Exercises: Stretches   Other Neck Stretches lateral neck flexion x 15 sec x 2 reps each side, to tolerance    Other Neck Stretches 3 way doorway stretch 2 reps 20 sec hold  PT Education - 07/08/16 1156    Education provided Yes   Education Details HEP    Person(s) Educated Patient   Methods Explanation;Demonstration;Tactile cues;Verbal cues;Handout   Comprehension Verbalized understanding;Returned demonstration;Verbal cues required;Tactile cues required             PT Long Term Goals - 07/08/16 1113      PT LONG TERM GOAL #1   Title Improve posture and alignment allowing patient to demonstrate upright posture with good position of head/neck/shoulders 07/29/16   Time 6   Period Weeks   Status On-going     PT LONG TERM GOAL #2   Title Imrprove cervical and shoulder ROM to WFL's throughtout 07/29/16   Time 6   Period Weeks   Status On-going     PT LONG TERM GOAL #3   Title Patient reports that she is able to  sleep for 6 or more hours per night 07/29/16   Time 6   Period Weeks   Status On-going     PT LONG TERM GOAL #4   Title Independent in HEP 07/29/16   Time 6   Period Weeks   Status On-going     PT LONG TERM GOAL #5   Title Improve FOTO to </= 54% limitation 07/29/16   Time 6   Period Weeks   Status On-going               Plan - 07/08/16 1157    Clinical Impression Statement Continued improvement - posture improving; pain decreasing; Responded well to manual work. Contoinues to have significant palpable tightness through Rt > Lt upper quarter. Gradually progressing toward stated goals of therapy.    Rehab Potential Good   PT Frequency 2x / week   PT Duration 6 weeks   PT Treatment/Interventions Patient/family education;ADLs/Self Care Home Management;Neuromuscular re-education;Cryotherapy;Electrical Stimulation;Iontophoresis 4mg /ml Dexamethasone;Moist Heat;Traction;Ultrasound;Manual techniques;Dry needling;Therapeutic activities;Therapeutic exercise   PT Next Visit Plan continue progressive postural strengthening, neck ROM.  TDN vs manual therapy - focus on manual techniques to release muscular tightness through Rt > Lt upper quarter   Consulted and Agree with Plan of Care Patient      Patient will benefit from skilled therapeutic intervention in order to improve the following deficits and impairments:  Postural dysfunction, Improper body mechanics, Pain, Decreased range of motion, Decreased mobility, Increased fascial restricitons, Increased muscle spasms, Decreased activity tolerance  Visit Diagnosis: Abnormal posture  Other symptoms and signs involving the musculoskeletal system  Cervicalgia     Problem List Patient Active Problem List   Diagnosis Date Noted  . S/P left knee arthroscopy 10/20/2012    [name] Nilda Simmer PT, MPH  07/08/2016, 12:02 PM  [facility] [place], [place], [address] Phone:  [phone]   Fax:  [phone]  Name: [name] MRN: [id number] Date of Birth: Dec 04, 1965

## 2016-07-13 ENCOUNTER — Ambulatory Visit (INDEPENDENT_AMBULATORY_CARE_PROVIDER_SITE_OTHER): Payer: Managed Care, Other (non HMO) | Admitting: Physical Therapy

## 2016-07-13 DIAGNOSIS — M542 Cervicalgia: Secondary | ICD-10-CM | POA: Diagnosis not present

## 2016-07-13 DIAGNOSIS — R29898 Other symptoms and signs involving the musculoskeletal system: Secondary | ICD-10-CM | POA: Diagnosis not present

## 2016-07-13 DIAGNOSIS — R293 Abnormal posture: Secondary | ICD-10-CM | POA: Diagnosis not present

## 2016-07-13 NOTE — Therapy (Signed)
[place], [place], [address] Phone: [phone]   Fax:  [phone]  Physical Therapy Treatment  Patient Details  Name: [name] MRN: [id number] Date of Birth: 1966-08-19 Referring Provider: Dr. Kristeen Miss  Encounter Date: 07/13/2016      PT End of Session - 07/13/16 0902    Visit Number 8   Number of Visits 12   Date for PT Re-Evaluation 07/29/16   PT Start Time 0850   PT Stop Time 0949   PT Time Calculation (min) 59 min   Activity Tolerance Patient limited by pain      Past Medical History:  Diagnosis Date  . Cancer ([place]) 1990   Cervical  . Carpal tunnel syndrome, bilateral   . Complication of anesthesia INTRAOP HYPERTENSION W/ TONSILLECTOMY SURGERY AGE 24--  NO ISSUES SINCE  . Left knee pain     Past Surgical History:  Procedure Laterality Date  . ANTERIOR CERVICAL DECOMP/DISCECTOMY FUSION  10-16-2010   C4  -  C6  . CERVICAL BIOPSY  W/ LOOP ELECTRODE EXCISION  2010  . CESAREAN SECTION  1987  . CHOLECYSTECTOMY  2009  . FOOT SURGERY  2010   LEFT  . KNEE ARTHROSCOPY  10/20/2012   Procedure: ARTHROSCOPY KNEE;  Surgeon: Mauri Pole, MD;  Location: [facility];  Service: Orthopedics;  Laterality: Left;  medial menisectomy medial patellar condroplasty and sinovectomy  . TONSILLECTOMY  AGE 24  . TRANSTHORACIC ECHOCARDIOGRAM  06-20-2010   NORMAL LVSF/ EF 55%  . TUBAL LIGATION  1988    There were no vitals filed for this visit.      Subjective Assessment - 07/13/16 0858    Subjective Pt reports the passive stretching that was done last session really helped to loosen her neck up.  She is having trouble sleeping at night again, "it's a fight for me to sleep". Rt arm is still "going numb" with gripping activities.     Currently in Pain? Yes   Pain Score 3    Pain Location Neck   Pain Orientation Right;Mid   Pain Descriptors / Indicators Squeezing;Tightness    Aggravating Factors  sitting unsupported, driving    Pain Relieving Factors medication, laying down.             [facility] PT Assessment - 07/13/16 0001      Assessment   Medical Diagnosis Cervical disc fusion C3/4   Referring Provider Dr. Kristeen Miss   Onset Date/Surgical Date 03/20/16   Hand Dominance Right   Next MD Visit 07/23/16           [facility] Adult PT Treatment/Exercise - 07/13/16 0001      Neck Exercises: Machines for Strengthening   UBE (Upper Arm Bike) L1: 1.5 min standing (backwards)      Neck Exercises: Supine   Neck Retraction 10 reps;5 secs   Other Supine Exercise neural mobilization 1 min x 2 each side    Other Supine Exercise unilateral snow angel arm to tolerance x 10 reps. scap squeeze x 5 sec hold x 10 reps      Moist Heat Therapy   Number Minutes Moist Heat 15 Minutes   Moist Heat Location Cervical;Lumbar Spine     Electrical Stimulation   Electrical Stimulation Location bilat cervical paraspinals and upper trap    Electrical Stimulation Action IFC   Electrical Stimulation Parameters  to tolerance    Electrical Stimulation Goals Tone;Pain  Manual Therapy   Manual Therapy Taping   Manual therapy comments pt hooklying.    Soft tissue mobilization bilat cervical paraspinals; Rt upper trap; levator; pecs; UE; wrist and hand   Myofascial Release cervical and pecs/Rt UE   Manual Traction gentle manual elongation through treatment    Kinesiotex Inhibit Muscle     Kinesiotix   Inhibit Muscle  I strip placed distal to prox with 15% stretch on Rt and Lt upper trap to inhibit muscle and decrease pain.             PT Long Term Goals - 07/08/16 1113      PT LONG TERM GOAL #1   Title Improve posture and alignment allowing patient to demonstrate upright posture with good position of head/neck/shoulders 07/29/16   Time 6   Period Weeks   Status On-going     PT LONG TERM GOAL #2   Title Imrprove cervical and shoulder ROM to WFL's throughtout 07/29/16    Time 6   Period Weeks   Status On-going     PT LONG TERM GOAL #3   Title Patient reports that she is able to sleep for 6 or more hours per night 07/29/16   Time 6   Period Weeks   Status On-going     PT LONG TERM GOAL #4   Title Independent in HEP 07/29/16   Time 6   Period Weeks   Status On-going     PT LONG TERM GOAL #5   Title Improve FOTO to </= 54% limitation 07/29/16   Time 6   Period Weeks   Status On-going               Plan - 07/13/16 1318    Clinical Impression Statement 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 She continues to be very point tender in bilat pecs and posterior neck musculature.  Making gradual progress towards stated goals of therapy.    Rehab Potential Good   PT Frequency 2x / week   PT Duration 6 weeks   PT Treatment/Interventions Patient/family education;ADLs/Self Care Home Management;Neuromuscular re-education;Cryotherapy;Electrical Stimulation;Iontophoresis 4mg /ml Dexamethasone;Moist Heat;Traction;Ultrasound;Manual techniques;Dry needling;Therapeutic activities;Therapeutic exercise   PT Next Visit Plan continue progressive postural strengthening, neck ROM.  TDN vs manual therapy - focus on manual techniques to release muscular tightness through Rt > Lt upper quarter   Consulted and Agree with Plan of Care Patient      Patient will benefit from skilled therapeutic intervention in order to improve the following deficits and impairments:  Postural dysfunction, Improper body mechanics, Pain, Decreased range of motion, Decreased mobility, Increased fascial restricitons, Increased muscle spasms, Decreased activity tolerance  Visit Diagnosis: Abnormal posture  Other symptoms and signs involving the musculoskeletal system  Cervicalgia     Problem List Patient Active Problem List   Diagnosis Date Noted  . S/P left knee arthroscopy 10/20/2012   Kerin Perna, PTA 07/13/16 1:29 PM  [place], [place], [address] Phone: [phone]   Fax:  [phone]  Name: [name] MRN: [id number] Date of Birth: 1966/01/19

## 2016-07-17 ENCOUNTER — Ambulatory Visit (INDEPENDENT_AMBULATORY_CARE_PROVIDER_SITE_OTHER): Payer: Managed Care, Other (non HMO) | Admitting: Physical Therapy

## 2016-07-17 DIAGNOSIS — M542 Cervicalgia: Secondary | ICD-10-CM

## 2016-07-17 DIAGNOSIS — R29898 Other symptoms and signs involving the musculoskeletal system: Secondary | ICD-10-CM | POA: Diagnosis not present

## 2016-07-17 DIAGNOSIS — R293 Abnormal posture: Secondary | ICD-10-CM
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
## 2016-07-20 ENCOUNTER — Ambulatory Visit (INDEPENDENT_AMBULATORY_CARE_PROVIDER_SITE_OTHER): Payer: Managed Care, Other (non HMO) | Admitting: Physical Therapy

## 2016-07-20 DIAGNOSIS — M542 Cervicalgia: Secondary | ICD-10-CM

## 2016-07-20 DIAGNOSIS — R29898 Other symptoms and signs involving the musculoskeletal system: Secondary | ICD-10-CM | POA: Diagnosis not present

## 2016-07-20 DIAGNOSIS — R293 Abnormal posture: Secondary | ICD-10-CM | POA: Diagnosis not present
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
## 2016-07-22 ENCOUNTER — Ambulatory Visit (INDEPENDENT_AMBULATORY_CARE_PROVIDER_SITE_OTHER): Payer: Managed Care, Other (non HMO) | Admitting: Physical Therapy

## 2016-07-22 DIAGNOSIS — R293 Abnormal posture: Secondary | ICD-10-CM

## 2016-07-22 DIAGNOSIS — M542 Cervicalgia: Secondary | ICD-10-CM | POA: Diagnosis not present

## 2016-07-22 DIAGNOSIS — R29898 Other symptoms and signs involving the musculoskeletal system: Secondary | ICD-10-CM | POA: Diagnosis not present
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
## 2016-08-03 ENCOUNTER — Encounter: Payer: Self-pay | Admitting: Physical Therapy

## 2016-08-03 ENCOUNTER — Ambulatory Visit (INDEPENDENT_AMBULATORY_CARE_PROVIDER_SITE_OTHER): Payer: Managed Care, Other (non HMO) | Admitting: Physical Therapy

## 2016-08-03 DIAGNOSIS — M542 Cervicalgia: Secondary | ICD-10-CM

## 2016-08-03 DIAGNOSIS — R293 Abnormal posture: Secondary | ICD-10-CM

## 2016-08-03 DIAGNOSIS — R29898 Other symptoms and signs involving the musculoskeletal system: Secondary | ICD-10-CM | POA: Diagnosis not present

## 2016-08-03 NOTE — Therapy (Signed)
[place], [place], [address] Phone: [phone]   Fax:  [phone]  Physical Therapy Treatment  Patient Details  Name: [name] MRN: [id number] Date of Birth: May 20, 1966 Referring Provider: Dr Kristeen Miss  Encounter Date: 08/03/2016      PT End of Session - 08/03/16 0808    Visit Number 12   Number of Visits 20   Date for PT Re-Evaluation 08/31/16   PT Start Time 0808   PT Stop Time [id number]   PT Time Calculation (min) 49 min   Activity Tolerance Patient limited by pain      Past Medical History:  Diagnosis Date  . Cancer ([place]) 1990   Cervical  . Carpal tunnel syndrome, bilateral   . Complication of anesthesia INTRAOP HYPERTENSION W/ TONSILLECTOMY SURGERY AGE 50--  NO ISSUES SINCE  . Left knee pain     Past Surgical History:  Procedure Laterality Date  . ANTERIOR CERVICAL DECOMP/DISCECTOMY FUSION  10-16-2010   C4  -  C6  . CERVICAL BIOPSY  W/ LOOP ELECTRODE EXCISION  2010  . CESAREAN SECTION  1987  . CHOLECYSTECTOMY  2009  . FOOT SURGERY  2010   LEFT  . KNEE ARTHROSCOPY  10/20/2012   Procedure: ARTHROSCOPY KNEE;  Surgeon: Mauri Pole, MD;  Location: [facility];  Service: Orthopedics;  Laterality: Left;  medial menisectomy medial patellar condroplasty and sinovectomy  . TONSILLECTOMY  AGE 50  . TRANSTHORACIC ECHOCARDIOGRAM  06-20-2010   NORMAL LVSF/ EF 55%  . TUBAL LIGATION  1988    There were no vitals filed for this visit.      Subjective Assessment - 08/03/16 0811    Subjective Pt reports she is having a really bad day, having and MRI on Wed for her low back then follows up with MD the next week. Feels like she needs needling   Currently in Pain? Yes   Pain Score 9    Pain Location Neck   Pain Orientation Left;Right   Pain Descriptors / Indicators Heaviness   Pain Type Chronic pain   Pain Radiating Towards into the Lt shoulder and numbness/tingling  into the Lt hand   Pain Onset More than a month ago   Pain Frequency Constant   Aggravating Factors  raising the Lt arm at all   Pain Relieving Factors some stretches            [facility] PT Assessment - 08/03/16 0001      Assessment   Medical Diagnosis Cervical disc fusion C3/4   Referring Provider Dr Kristeen Miss   Onset Date/Surgical Date 03/20/16   Hand Dominance Right   Next MD Visit 08/10/10     ROM / Strength   AROM / PROM / Strength AROM;Strength     AROM   Overall AROM  Unable to assess   Overall AROM Comments pain with all cervical and shoulder ROM    AROM Assessment Site Shoulder;Cervical   Right/Left Shoulder Left;Right   Right Shoulder Extension 40 Degrees   Right Shoulder Flexion 142 Degrees   Left Shoulder Extension 37 Degrees   Left Shoulder Flexion 128 Degrees  with pain   Cervical Flexion 15   Cervical Extension 12   Cervical - Right Side Bend 12   Cervical - Left Side Bend 15   Cervical - Right Rotation 26  34 after tx   Cervical - Left Rotation 14  28after tx  Strength   Overall Strength Comments NA due to level of pain with AROM                     [facility] Adult PT Treatment/Exercise - 08/03/16 0001      Modalities   Modalities Electrical Stimulation;Moist Heat     Moist Heat Therapy   Number Minutes Moist Heat 15 Minutes   Moist Heat Location Cervical;Lumbar Spine     Electrical Stimulation   Electrical Stimulation Location bilat cervical paraspinals and upper trap    Electrical Stimulation Action IFC   Electrical Stimulation Parameters  to tolerance   Electrical Stimulation Goals Tone;Pain     Manual Therapy   Manual Therapy Soft tissue mobilization   Soft tissue mobilization bilat cervical paraspinals; Rt upper trap; levator; pecs; UE; wrist and hand     Neck Exercises: Stretches   Levator Stretch 2 reps;20 seconds   Neck Stretch 2 reps;20 seconds          Trigger Point Dry Needling - 08/03/16 0820    Consent  Given? Yes   Education Handout Provided No   Upper Trapezius Response Palpable increased muscle length;Twitch reponse elicited  bilat   Longissimus Response Twitch response elicited;Palpable increased muscle length  Lt side                   PT Long Term Goals - 08/03/16 0843      PT LONG TERM GOAL #1   Title Improve posture and alignment allowing patient to demonstrate upright posture with good position of head/neck/shoulders 08/31/16   Time 4   Period Weeks   Status New     PT LONG TERM GOAL #2   Title Imrprove cervical and shoulder ROM to WFL's throughtout 08/31/16   Time 4   Period Weeks   Status On-going     PT LONG TERM GOAL #3   Title Patient reports that she is able to sleep for 6 or more hours per night 08/31/16   Time 4   Period Weeks   Status On-going     PT LONG TERM GOAL #4   Title Independent in HEP 08/31/16   Time 4   Period Weeks   Status On-going     PT LONG TERM GOAL #5   Title Improve FOTO to </= 54% limitation 08/31/16   Time 4   Period Weeks   Status On-going  69% limited ( was 72% initially)                Plan - 08/03/16 0845    Clinical Impression Statement Jaylianna is making slow progress in pain reduction and ROM improvements.  She was out of town last week and had a set back due to not being able to have therapy.  She continues to have sigificant tightness in her cervical spine and surrounding musculature.  She would benefit from more therapy to restore PLOf   Rehab Potential Good   PT Frequency 2x / week   PT Duration 4 weeks   PT Treatment/Interventions Patient/family education;ADLs/Self Care Home Management;Neuromuscular re-education;Cryotherapy;Electrical Stimulation;Iontophoresis 4mg /ml Dexamethasone;Moist Heat;Traction;Ultrasound;Manual techniques;Dry needling;Therapeutic activities;Therapeutic exercise   PT Next Visit Plan continue TDN, manual work and then spinal stabilization    Consulted and Agree with Plan of  Care Patient      Patient will benefit from skilled therapeutic intervention in order to improve the following deficits and impairments:  Postural dysfunction, Improper body mechanics, Pain, Decreased range of motion, Decreased  mobility, Increased fascial restricitons, Increased muscle spasms, Decreased activity tolerance  Visit Diagnosis: Cervicalgia - Plan: PT plan of care cert/re-cert  Other symptoms and signs involving the musculoskeletal system - Plan: PT plan of care cert/re-cert  Abnormal posture - Plan: PT plan of care cert/re-cert     Problem List Patient Active Problem List   Diagnosis Date Noted  . S/P left knee arthroscopy 10/20/2012    Jeral Pinch PT  08/03/2016, 8:49 AM  [facility] [place], [place], [address] Phone: [phone]   Fax:  [phone]  Name: RASHIDAH TORTORIELLO MRN: [id number] Date of Birth: 04/15/1966

## 2016-08-10 ENCOUNTER — Ambulatory Visit (INDEPENDENT_AMBULATORY_CARE_PROVIDER_SITE_OTHER): Payer: Managed Care, Other (non HMO) | Admitting: Physical Therapy

## 2016-08-10 DIAGNOSIS — R29898 Other symptoms and signs involving the musculoskeletal system: Secondary | ICD-10-CM

## 2016-08-10 DIAGNOSIS — R293 Abnormal posture: Secondary | ICD-10-CM | POA: Diagnosis not present

## 2016-08-10 DIAGNOSIS — M542 Cervicalgia: Secondary | ICD-10-CM
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
## 2016-08-13 ENCOUNTER — Ambulatory Visit (INDEPENDENT_AMBULATORY_CARE_PROVIDER_SITE_OTHER): Payer: Managed Care, Other (non HMO) | Admitting: Physical Therapy

## 2016-08-13 DIAGNOSIS — M542 Cervicalgia: Secondary | ICD-10-CM

## 2016-08-13 DIAGNOSIS — R29898 Other symptoms and signs involving the musculoskeletal system: Secondary | ICD-10-CM

## 2016-08-13 DIAGNOSIS — R293 Abnormal posture: Secondary | ICD-10-CM

## 2016-08-13 NOTE — Therapy (Signed)
[place], [place], [address] Phone: [phone]   Fax:  [phone]  Physical Therapy Treatment  Patient Details  Name: [name] MRN: [id number] Date of Birth: Nov 04, 1965 Referring Provider: Dr. Kristeen Miss  Encounter Date: 08/13/2016      PT End of Session - 08/13/16 0935    Visit Number 14   Number of Visits 20   Date for PT Re-Evaluation 08/31/16   PT Start Time 0931   PT Stop Time 1029   PT Time Calculation (min) 58 min   Activity Tolerance Patient tolerated treatment well      Past Medical History:  Diagnosis Date  . Cancer ([place]) 1990   Cervical  . Carpal tunnel syndrome, bilateral   . Complication of anesthesia INTRAOP HYPERTENSION W/ TONSILLECTOMY SURGERY AGE 32--  NO ISSUES SINCE  . Left knee pain     Past Surgical History:  Procedure Laterality Date  . ANTERIOR CERVICAL DECOMP/DISCECTOMY FUSION  10-16-2010   C4  -  C6  . CERVICAL BIOPSY  W/ LOOP ELECTRODE EXCISION  2010  . CESAREAN SECTION  1987  . CHOLECYSTECTOMY  2009  . FOOT SURGERY  2010   LEFT  . KNEE ARTHROSCOPY  10/20/2012   Procedure: ARTHROSCOPY KNEE;  Surgeon: Mauri Pole, MD;  Location: [facility];  Service: Orthopedics;  Laterality: Left;  medial menisectomy medial patellar condroplasty and sinovectomy  . TONSILLECTOMY  AGE 32  . TRANSTHORACIC ECHOCARDIOGRAM  06-20-2010   NORMAL LVSF/ EF 55%  . TUBAL LIGATION  1988    There were no vitals filed for this visit.      Subjective Assessment - 08/13/16 0936    Subjective Pt reports she had MRI and the results show 2 bulging discs in low back.  She hasn't been sleeping well due to low back pain.  She believes holding off on band exercise has helped clm her neck down.    Patient Stated Goals get some motion back in her neck and get rid of the numbness in the Rt arm    Currently in Pain? Yes   Pain Score 2    Pain Location Neck   Pain  Orientation Right;Left   Pain Descriptors / Indicators Sore   Aggravating Factors  raising arm    Pain Relieving Factors stretching, being in supine, TDN, TENS   Pain Score 10   Pain Location Back   Pain Orientation Lower;Right;Left   Pain Descriptors / Indicators Sharp   Aggravating Factors  sitting, standing    Pain Relieving Factors supported supine, ice             [facility] PT Assessment - 08/13/16 0001      Assessment   Medical Diagnosis Cervical disc fusion C3/4   Referring Provider Dr. Kristeen Miss   Onset Date/Surgical Date 03/20/16   Hand Dominance Right          [facility] Adult PT Treatment/Exercise - 08/13/16 0001      Exercises   Exercises Shoulder     Neck Exercises: Seated   Neck Retraction 10 reps;5 secs   Cervical Rotation Right;Left;5 reps   Lateral Flexion Right;Left;5 reps   Shoulder Shrugs 10 reps  with 1# weights in hands    Shoulder Rolls 10 reps;Backwards   Other Seated Exercise rowing with yellow band in both hands x 10 reps x 2 reps    Other Seated Exercise elbow flexion with 2# x 10 reps  each side .     Neck Exercises: Supine   Other Supine Exercise bench press with 1# weight in each hand, x 10   Other Supine Exercise unilateral snow angel arm to tolerance x 10 reps.      Shoulder Exercises: Pulleys   Flexion 2 minutes   ABduction 2 minutes     Shoulder Exercises: Stretch   Other Shoulder Stretches low arm position doorway stretch x 20 sec x 3 reps      Modalities   Modalities Electrical Stimulation;Moist Heat;Cryotherapy     Moist Heat Therapy   Number Minutes Moist Heat 20 Minutes   Moist Heat Location Cervical     Cryotherapy   Number Minutes Cryotherapy 20 Minutes   Cryotherapy Location Lumbar Spine   Type of Cryotherapy Ice pack     Electrical Stimulation   Electrical Stimulation Location bilat cervical paraspinals and upper trap    Electrical Stimulation Action IFC   Electrical Stimulation Parameters  to tolerance     Electrical Stimulation Goals Tone;Pain                PT Education - 08/13/16 1018    Education provided Yes   Education Details HEP - added rowing (yellow band) and low doorway stretch to HEP.    Person(s) Educated Patient   Methods Explanation;Demonstration;Tactile cues;Verbal cues   Comprehension Verbalized understanding             PT Long Term Goals - 08/10/16 1252      PT LONG TERM GOAL #1   Title Improve posture and alignment allowing patient to demonstrate upright posture with good position of head/neck/shoulders 08/31/16   Time 4   Period Weeks   Status On-going     PT LONG TERM GOAL #2   Title Imrprove cervical and shoulder ROM to WFL's throughtout 08/31/16   Time 4   Period Weeks   Status On-going     PT LONG TERM GOAL #3   Title Patient reports that she is able to sleep for 6 or more hours per night 08/31/16   Time 4   Period Weeks   Status On-going     PT LONG TERM GOAL #4   Title Independent in HEP 08/31/16   Time 4   Period Weeks   Status On-going     PT LONG TERM GOAL #5   Title Improve FOTO to </= 54% limitation 08/31/16   Time 4   Period Weeks   Status On-going               Plan - 08/13/16 1004    Clinical Impression Statement Pt tolerated all supported seated strengthening / ROM exercises without increase in pain/symptoms. Pt demonstrated improved exercise tolerance today.  Pt remains motivated to progress towards established goals.     Rehab Potential Good   PT Frequency 2x / week   PT Duration 4 weeks   PT Treatment/Interventions Patient/family education;ADLs/Self Care Home Management;Neuromuscular re-education;Cryotherapy;Electrical Stimulation;Iontophoresis 4mg /ml Dexamethasone;Moist Heat;Traction;Ultrasound;Manual techniques;Dry needling;Therapeutic activities;Therapeutic exercise   PT Next Visit Plan Continue to progress postural strengthening/ spinal stabilization.  Manual therapy and modalities as indicated.     Consulted and Agree with Plan of Care Patient      Patient will benefit from skilled therapeutic intervention in order to improve the following deficits and impairments:  Postural dysfunction, Improper body mechanics, Pain, Decreased range of motion, Decreased mobility, Increased fascial restricitons, Increased muscle spasms, Decreased activity tolerance  Visit Diagnosis: Cervicalgia  Other  symptoms and signs involving the musculoskeletal system  Abnormal posture     Problem List Patient Active Problem List   Diagnosis Date Noted  . S/P left knee arthroscopy 10/20/2012   Kerin Perna, PTA 08/13/16 10:18 AM  [place], [place], [address] Phone: [phone]   Fax:  [phone]  Name: [name] MRN: [id number] Date of Birth: 07/28/1966

## 2016-08-17 ENCOUNTER — Encounter: Payer: Self-pay | Admitting: Physical Therapy

## 2016-08-18 ENCOUNTER — Encounter: Payer: Self-pay | Admitting: Physical Therapy

## 2016-08-18 ENCOUNTER — Ambulatory Visit (INDEPENDENT_AMBULATORY_CARE_PROVIDER_SITE_OTHER): Payer: Managed Care, Other (non HMO) | Admitting: Physical Therapy

## 2016-08-18 DIAGNOSIS — R29898 Other symptoms and signs involving the musculoskeletal system: Secondary | ICD-10-CM

## 2016-08-18 DIAGNOSIS — R293 Abnormal posture: Secondary | ICD-10-CM

## 2016-08-18 DIAGNOSIS — M542 Cervicalgia: Secondary | ICD-10-CM

## 2016-08-18 NOTE — Therapy (Signed)
[place], [place], [address] Phone: [phone]   Fax:  [phone]  Physical Therapy Treatment  Patient Details  Name: [name] MRN: [id number] Date of Birth: [birth year] Referring Provider: Dr. Kristeen Miss  Encounter Date: 08/18/2016      PT End of Session - 08/18/16 0852    Visit Number 15   Number of Visits 20   Date for PT Re-Evaluation 08/31/16   PT Start Time 0853   PT Stop Time 0947   PT Time Calculation (min) 54 min   Activity Tolerance Patient tolerated treatment well      Past Medical History:  Diagnosis Date  . Cancer ([place]) 1990   Cervical  . Carpal tunnel syndrome, bilateral   . Complication of anesthesia INTRAOP HYPERTENSION W/ TONSILLECTOMY SURGERY AGE 29--  NO ISSUES SINCE  . Left knee pain     Past Surgical History:  Procedure Laterality Date  . ANTERIOR CERVICAL DECOMP/DISCECTOMY FUSION  10-16-2010   C4  -  C6  . CERVICAL BIOPSY  W/ LOOP ELECTRODE EXCISION  2010  . CESAREAN SECTION  1987  . CHOLECYSTECTOMY  2009  . FOOT SURGERY  2010   LEFT  . KNEE ARTHROSCOPY  10/20/2012   Procedure: ARTHROSCOPY KNEE;  Surgeon: Mauri Pole, MD;  Location: [facility];  Service: Orthopedics;  Laterality: Left;  medial menisectomy medial patellar condroplasty and sinovectomy  . TONSILLECTOMY  AGE 29  . TRANSTHORACIC ECHOCARDIOGRAM  06-20-2010   NORMAL LVSF/ EF 55%  . TUBAL LIGATION  1988    There were no vitals filed for this visit.      Subjective Assessment - 08/18/16 0858    Subjective [name] reports her neck is doing well, lets not jinx it.  Her low back is killing her, has injection on Friday.    Currently in Pain? Yes   Pain Score 10-Worst pain ever   Pain Location Back   Pain Orientation Right   Pain Descriptors / Indicators Shooting;Sharp   Pain Type Acute pain   Aggravating Factors  everything   Pain Relieving Factors nothing right now                           [facility] Adult PT Treatment/Exercise - 08/18/16 0001      Neck Exercises: Supine   Other Supine Exercise TA contractions with shoulder flex holding 1# wts     Neck Exercises: Prone   UE Flexion with Stabilization Limitations 10 reps each arm, then each leg lifts    Plank 10 reps opposite arm/leg lifts.    Other Prone Exercise POE, then 2x5 press ups, LBP down to 4/10     Shoulder Exercises: Supine   Other Supine Exercises 10 reps shoudler presses and thoracic lifts     Modalities   Modalities Electrical Stimulation;Moist Heat;Cryotherapy     Moist Heat Therapy   Number Minutes Moist Heat 20 Minutes   Moist Heat Location Cervical     Cryotherapy   Number Minutes Cryotherapy 20 Minutes   Cryotherapy Location Lumbar Spine   Type of Cryotherapy Ice pack     Electrical Stimulation   Electrical Stimulation Location lumbar/cervical    Electrical Stimulation Action premod   Electrical Stimulation Parameters to tolerance   Electrical Stimulation Goals Tone;Pain                     PT Long  Term Goals - 08/18/16 0907      PT LONG TERM GOAL #1   Title Improve posture and alignment allowing patient to demonstrate upright posture with good position of head/neck/shoulders 08/31/16   Status Achieved  head/neck/shoulders improved however not flexed at hips d/t LBP     PT LONG TERM GOAL #2   Title Imrprove cervical and shoulder ROM to WFL's throughtout 08/31/16     PT LONG TERM GOAL #3   Title Patient reports that she is able to sleep for 6 or more hours per night 08/31/16   Status On-going  limited more due to LBP now      PT LONG TERM GOAL #4   Title Independent in HEP 08/31/16   Status On-going     PT LONG TERM GOAL #5   Title Improve FOTO to </= 54% limitation 08/31/16   Status On-going               Plan - 08/18/16 0915    Clinical Impression Statement [name] is doing well with managing her neck symptoms.  Biggest  limiting factor right now is her LBP.  She is scheduled to have injections on Friday and is going to see if she can get an order for [place] to treat this.    Rehab Potential Good   PT Frequency 2x / week   PT Duration 4 weeks   PT Treatment/Interventions Patient/family education;ADLs/Self Care Home Management;Neuromuscular re-education;Cryotherapy;Electrical Stimulation;Iontophoresis 4mg /ml Dexamethasone;Moist Heat;Traction;Ultrasound;Manual techniques;Dry needling;Therapeutic activities;Therapeutic exercise   PT Next Visit Plan assess for discharge of neck and see if she received an order for her lumbar.    Consulted and Agree with Plan of Care Patient      Patient will benefit from skilled therapeutic intervention in order to improve the following deficits and impairments:  Postural dysfunction, Improper body mechanics, Pain, Decreased range of motion, Decreased mobility, Increased fascial restricitons, Increased muscle spasms, Decreased activity tolerance  Visit Diagnosis: Cervicalgia  Other symptoms and signs involving the musculoskeletal system  Abnormal posture     Problem List Patient Active Problem List   Diagnosis Date Noted  . S/P left knee arthroscopy 10/20/2012    Jeral Pinch PT  08/18/2016, 9:28 AM   [facility] [place], [place], [address] Phone: [phone]   Fax:  [phone]  Name: [name] MRN: [id number] Date of Birth: Feb 16, 1966

## 2016-08-20 ENCOUNTER — Ambulatory Visit (INDEPENDENT_AMBULATORY_CARE_PROVIDER_SITE_OTHER): Payer: Managed Care, Other (non HMO) | Admitting: Physical Therapy

## 2016-08-20 DIAGNOSIS — R293 Abnormal posture: Secondary | ICD-10-CM | POA: Diagnosis not present

## 2016-08-20 DIAGNOSIS — M542 Cervicalgia: Secondary | ICD-10-CM | POA: Diagnosis not present

## 2016-08-20 DIAGNOSIS — R29898 Other symptoms and signs involving the musculoskeletal system: Secondary | ICD-10-CM | POA: Diagnosis not present

## 2016-08-20 NOTE — Therapy (Signed)
[place], [place], [address] Phone: [phone]   Fax:  [phone]  Physical Therapy Treatment  Patient Details  Name: [name] MRN: [id number] Date of Birth: 07-23-1966 Referring Provider: Dr. Kristeen Miss  Encounter Date: 08/20/2016      PT End of Session - 08/20/16 0937    Visit Number 16   Number of Visits 20   Date for PT Re-Evaluation 08/31/16   PT Start Time 0933   PT Stop Time 1036   PT Time Calculation (min) 63 min      Past Medical History:  Diagnosis Date  . Cancer ([place]) 1990   Cervical  . Carpal tunnel syndrome, bilateral   . Complication of anesthesia INTRAOP HYPERTENSION W/ TONSILLECTOMY SURGERY AGE 68--  NO ISSUES SINCE  . Left knee pain     Past Surgical History:  Procedure Laterality Date  . ANTERIOR CERVICAL DECOMP/DISCECTOMY FUSION  10-16-2010   C4  -  C6  . CERVICAL BIOPSY  W/ LOOP ELECTRODE EXCISION  2010  . CESAREAN SECTION  1987  . CHOLECYSTECTOMY  2009  . FOOT SURGERY  2010   LEFT  . KNEE ARTHROSCOPY  10/20/2012   Procedure: ARTHROSCOPY KNEE;  Surgeon: Mauri Pole, MD;  Location[facility];  Service: Orthopedics;  Laterality: Left;  medial menisectomy medial patellar condroplasty and sinovectomy  . TONSILLECTOMY  AGE 68  . TRANSTHORACIC ECHOCARDIOGRAM  06-20-2010   NORMAL LVSF/ EF 55%  . TUBAL LIGATION  1988    There were no vitals filed for this visit.      Subjective Assessment - 08/20/16 0938    Subjective Pt reports her neck is still doing well.  Lower back has been a little better since last visit.  She receives injection in low back on Friday.  Still awaiting referral for LB.    Currently in Pain? Yes   Pain Score 4    Pain Location Back   Pain Orientation Right   Pain Descriptors / Indicators Shooting;Sharp            [facility] PT Assessment - 08/20/16 0001      AROM   Right/Left Shoulder Right;Left   Right  Shoulder Flexion 142 Degrees  supine   Right Shoulder ABduction 155 Degrees  supine   Left Shoulder Flexion 165 Degrees  supine   Left Shoulder ABduction 148 Degrees   Left Shoulder Internal Rotation --  supine   Cervical - Right Rotation 40   Cervical - Left Rotation 35             [facility] Adult PT Treatment/Exercise - 08/20/16 0001      Neck Exercises: Machines for Strengthening   UBE (Upper Arm Bike) L1: 30 sec each way, standing.      Neck Exercises: Supine   Other Supine Exercise TA contractions with shoulder flex (forward punch) with 1# weight x 10 each arm.   Then flexion to end range -without weight due to increased pain in neck x 5 reps each arm    Other Supine Exercise unilateral snow angel arm to tolerance x 10 reps.      Neck Exercises: Prone   Axial Exentsion 10 reps  with scap retraction   Shoulder Extension 5 reps   Other Prone Exercise POE, then 2x5 press ups     Shoulder Exercises: Supine   Other Supine Exercises 10 reps shoulder presses with thoracic lifts (5 sec hold)  Moist Heat Therapy   Number Minutes Moist Heat 20 Minutes   Moist Heat Location Cervical     Cryotherapy   Number Minutes Cryotherapy 20 Minutes   Cryotherapy Location Lumbar Spine   Type of Cryotherapy Ice pack     Electrical Stimulation   Electrical Stimulation Location lumbar/ Rt pec and Rt cervical paraspinals    Electrical Stimulation Action pre mod to each area.    Electrical Stimulation Parameters to tolerance    Electrical Stimulation Goals Tone;Pain     Manual Therapy   Myofascial Release Rt pec release,  Rt upper trap. suboccipital release.             PT Long Term Goals - 08/18/16 0907      PT LONG TERM GOAL #1   Title Improve posture and alignment allowing patient to demonstrate upright posture with good position of head/neck/shoulders 08/31/16   Status Achieved  head/neck/shoulders improved however not flexed at hips d/t LBP     PT LONG TERM GOAL #2    Title Imrprove cervical and shoulder ROM to WFL's throughtout 08/31/16     PT LONG TERM GOAL #3   Title Patient reports that she is able to sleep for 6 or more hours per night 08/31/16   Status On-going  limited more due to LBP now      PT LONG TERM GOAL #4   Title Independent in HEP 08/31/16   Status On-going     PT LONG TERM GOAL #5   Title Improve FOTO to </= 54% limitation 08/31/16   Status On-going               Plan - 08/20/16 1045    Clinical Impression Statement Pt did not tolerate exercises as well as last session; Rt shoulder pain increased to an 8/10.  Pain was reduced with use of estim/ MHP. Pt did demonstrate improved cervical rotation this visit.  Progressing towards unmet goals.    Rehab Potential Good   PT Frequency 2x / week   PT Duration 4 weeks   PT Treatment/Interventions Patient/family education;ADLs/Self Care Home Management;Neuromuscular re-education;Cryotherapy;Electrical Stimulation;Iontophoresis 4mg /ml Dexamethasone;Moist Heat;Traction;Ultrasound;Manual techniques;Dry needling;Therapeutic activities;Therapeutic exercise   PT Next Visit Plan assess for discharge of neck and see if she received an order for her lumbar.    Consulted and Agree with Plan of Care Patient      Patient will benefit from skilled therapeutic intervention in order to improve the following deficits and impairments:  Postural dysfunction, Improper body mechanics, Pain, Decreased range of motion, Decreased mobility, Increased fascial restricitons, Increased muscle spasms, Decreased activity tolerance  Visit Diagnosis: Cervicalgia  Other symptoms and signs involving the musculoskeletal system  Abnormal posture     Problem List Patient Active Problem List   Diagnosis Date Noted  . S/P left knee arthroscopy 10/20/2012   Kerin Perna, PTA 08/20/16 10:48 AM  [place], [place],  [address] Phone: [phone]   Fax:  [phone]  Name: [name] MRN: [id number] Date of Birth: 1966/05/25

## 2016-08-26 ENCOUNTER — Ambulatory Visit (INDEPENDENT_AMBULATORY_CARE_PROVIDER_SITE_OTHER): Payer: Managed Care, Other (non HMO) | Admitting: Physical Therapy

## 2016-08-26 DIAGNOSIS — R29898 Other symptoms and signs involving the musculoskeletal system: Secondary | ICD-10-CM | POA: Diagnosis not present

## 2016-08-26 DIAGNOSIS — M542 Cervicalgia: Secondary | ICD-10-CM

## 2016-08-26 DIAGNOSIS — R293 Abnormal posture: Secondary | ICD-10-CM

## 2016-08-26 NOTE — Therapy (Signed)
[place], [place], [address] Phone: [phone]   Fax:  [phone]  Physical Therapy Treatment  Patient Details  Name: [name] MRN: [id number] Date of Birth: [birth year] Referring Provider: Dr. Kristeen Miss  Encounter Date: 08/26/2016      PT End of Session - 08/26/16 0933    Visit Number 17   Number of Visits 20   Date for PT Re-Evaluation 08/31/16   PT Start Time 0930   PT Stop Time 1029   PT Time Calculation (min) 59 min   Activity Tolerance Patient limited by pain      Past Medical History:  Diagnosis Date  . Cancer ([place]) 1990   Cervical  . Carpal tunnel syndrome, bilateral   . Complication of anesthesia INTRAOP HYPERTENSION W/ TONSILLECTOMY SURGERY AGE 50--  NO ISSUES SINCE  . Left knee pain     Past Surgical History:  Procedure Laterality Date  . ANTERIOR CERVICAL DECOMP/DISCECTOMY FUSION  10-16-2010   C4  -  C6  . CERVICAL BIOPSY  W/ LOOP ELECTRODE EXCISION  2010  . CESAREAN SECTION  1987  . CHOLECYSTECTOMY  2009  . FOOT SURGERY  2010   LEFT  . KNEE ARTHROSCOPY  10/20/2012   Procedure: ARTHROSCOPY KNEE;  Surgeon: Mauri Pole, MD;  Location: [facility]  Service: Orthopedics;  Laterality: Left;  medial menisectomy medial patellar condroplasty and sinovectomy  . TONSILLECTOMY  AGE 50  . TRANSTHORACIC ECHOCARDIOGRAM  06-20-2010   NORMAL LVSF/ EF 55%  . TUBAL LIGATION  1988    There were no vitals filed for this visit.      Subjective Assessment - 08/26/16 0933    Subjective Pt reports she had injection last Friday; some relief has been felt.  "I'm walking more normal".   She has a referral to treat LB.  Her neck flared up after injection; believes it was from straining to transfer tables for procedure.    Patient Stated Goals get some motion back in her neck and get rid of the numbness in the Rt arm    Currently in Pain? Yes   Pain Score 7    Pain  Location Neck   Pain Orientation Left   Pain Descriptors / Indicators Sore   Pain Radiating Towards into Lt ant shoulder.    Aggravating Factors  driving, looking Lt.    Pain Relieving Factors massage, TDN, ice, TENS.    Pain Score 2   Pain Location Back   Pain Orientation Right;Left;Lower   Pain Descriptors / Indicators Sharp   Aggravating Factors  sitting, standing    Pain Relieving Factors ice, TENS.             [facility] PT Assessment - 08/26/16 0001      Assessment   Medical Diagnosis Cervical disc fusion C3/4   Referring Provider Dr. Kristeen Miss   Onset Date/Surgical Date 03/20/16   Hand Dominance Right     AROM   Right Shoulder Flexion 162 Degrees  supine   Left Shoulder Flexion 159 Degrees  supine           [facility] Adult PT Treatment/Exercise - 08/26/16 0001      Exercises   Exercises Lumbar     Neck Exercises: Supine   Other Supine Exercise TA contractions with shoulder flex (forward punch) without wt, then repeated with 1# weight x 10 each arm.   Then flexion to end range -without weight due  8 reps each arm.    Other Supine Exercise unilateral snow angel arm to tolerance x 10 reps.      Neck Exercises: Prone   Axial Exentsion 5 reps  2 sets   Shoulder Extension 5 reps   Other Prone Exercise POE x 30 sec x 2 reps  unable to tolerate press ups   Other Prone Exercise 5 reps hip ext each leg x 2 sets with core engaged.      Lumbar Exercises: Stretches   Passive Hamstring Stretch 2 reps;20 seconds     Lumbar Exercises: Supine   Ab Set 5 reps;5 seconds     Shoulder Exercises: Prone   Extension 5 reps;Both  with scap retraction     Shoulder Exercises: Standing   Other Standing Exercises row with yellow band, abdominals engaged x 5 reps x 3 sets     Shoulder Exercises: Pulleys   Flexion 2 minutes     Moist Heat Therapy   Number Minutes Moist Heat 20 Minutes   Moist Heat Location Cervical     Cryotherapy   Number Minutes Cryotherapy 20 Minutes    Cryotherapy Location Lumbar Spine   Type of Cryotherapy Ice pack     Electrical Stimulation   Electrical Stimulation Location lumbar/ Lt pec and Lt cervical paraspinals    Electrical Stimulation Action pre mod to each area   Electrical Stimulation Parameters to tolerance    Electrical Stimulation Goals Tone;Pain                     PT Long Term Goals - 08/18/16 0907      PT LONG TERM GOAL #1   Title Improve posture and alignment allowing patient to demonstrate upright posture with good position of head/neck/shoulders 08/31/16   Status Achieved  head/neck/shoulders improved however not flexed at hips d/t LBP     PT LONG TERM GOAL #2   Title Imrprove cervical and shoulder ROM to WFL's throughtout 08/31/16     PT LONG TERM GOAL #3   Title Patient reports that she is able to sleep for 6 or more hours per night 08/31/16   Status On-going  limited more due to LBP now      PT LONG TERM GOAL #4   Title Independent in HEP 08/31/16   Status On-going     PT LONG TERM GOAL #5   Title Improve FOTO to </= 54% limitation 08/31/16   Status On-going               Plan - 08/26/16 1012    Clinical Impression Statement Pt had relief from injection in LB last week.  She has continued pain in neck, radiating to Lt shoulder this week.  She demonstrated improved Rt shoulder flexion today.  She was only able to tolerate 5 rep sets today due to pain.     Rehab Potential Good   PT Frequency 2x / week   PT Duration 4 weeks   PT Treatment/Interventions Patient/family education;ADLs/Self Care Home Management;Neuromuscular re-education;Cryotherapy;Electrical Stimulation;Iontophoresis 4mg /ml Dexamethasone;Moist Heat;Traction;Ultrasound;Manual techniques;Dry needling;Therapeutic activities;Therapeutic exercise   PT Next Visit Plan Assess lumbar area.  Continue spinal stabilization.    Consulted and Agree with Plan of Care Patient      Patient will benefit from skilled therapeutic  intervention in order to improve the following deficits and impairments:  Postural dysfunction, Improper body mechanics, Pain, Decreased range of motion, Decreased mobility, Increased fascial restricitons, Increased muscle spasms, Decreased activity tolerance  Visit Diagnosis:  Cervicalgia  Other symptoms and signs involving the musculoskeletal system  Abnormal posture     Problem List Patient Active Problem List   Diagnosis Date Noted  . S/P left knee arthroscopy 10/20/2012   Kerin Perna, PTA 08/26/16 10:17 AM  [facility] [place], [place], [address] Phone: [phone]   Fax:  [phone]  Name: [name] MRN: [id number] Date of Birth: 09/15/1966

## 2016-08-28 ENCOUNTER — Encounter: Payer: Self-pay | Admitting: Physical Therapy

## 2016-08-31 ENCOUNTER — Encounter: Payer: Managed Care, Other (non HMO) | Admitting: Physical Therapy

## 2016-09-02 ENCOUNTER — Encounter: Payer: Self-pay | Admitting: Physical Therapy

## 2016-09-02 ENCOUNTER — Ambulatory Visit (INDEPENDENT_AMBULATORY_CARE_PROVIDER_SITE_OTHER): Payer: Managed Care, Other (non HMO) | Admitting: Physical Therapy

## 2016-09-02 DIAGNOSIS — M545 Low back pain, unspecified: Secondary | ICD-10-CM

## 2016-09-02 DIAGNOSIS — R29898 Other symptoms and signs involving the musculoskeletal system: Secondary | ICD-10-CM

## 2016-09-02 DIAGNOSIS — R293 Abnormal posture: Secondary | ICD-10-CM | POA: Diagnosis not present

## 2016-09-02 DIAGNOSIS — M542 Cervicalgia: Secondary | ICD-10-CM

## 2016-09-02 DIAGNOSIS — G8929 Other chronic pain: Secondary | ICD-10-CM | POA: Diagnosis not present

## 2016-09-02 NOTE — Patient Instructions (Signed)
  Lower Trunk Rotation Stretch   Keeping back flat and feet together, rotate knees to left side and then to right side in pain free range Repeat for 1 minute. Perform 3 sets.
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
## 2016-09-03 ENCOUNTER — Encounter: Payer: Self-pay | Admitting: Physical Therapy

## 2016-09-07 ENCOUNTER — Ambulatory Visit (INDEPENDENT_AMBULATORY_CARE_PROVIDER_SITE_OTHER): Payer: Managed Care, Other (non HMO) | Admitting: Physical Therapy

## 2016-09-07 DIAGNOSIS — M542 Cervicalgia: Secondary | ICD-10-CM | POA: Diagnosis not present

## 2016-09-07 DIAGNOSIS — M545 Low back pain, unspecified: Secondary | ICD-10-CM

## 2016-09-07 DIAGNOSIS — G8929 Other chronic pain: Secondary | ICD-10-CM

## 2016-09-07 DIAGNOSIS — R293 Abnormal posture: Secondary | ICD-10-CM | POA: Diagnosis not present

## 2016-09-07 DIAGNOSIS — R29898 Other symptoms and signs involving the musculoskeletal system: Secondary | ICD-10-CM | POA: Diagnosis not present

## 2016-09-07 NOTE — Patient Instructions (Signed)
  Abdominal Bracing With Pelvic Floor (Hook-Lying)   With neutral spine, tighten pelvic floor and abdominals. Hold 10 seconds. Repeat __10_ times. Do _1__ times a day.   Knee to Chest: Transverse Plane Stability   Bring one knee up, then return. Be sure pelvis does not roll side to side. Keep pelvis still. Lift knee __10_ times each leg. Restabilize pelvis. Repeat with other leg. Do _1-2__ sets, _1__ times per day.   Hip External Rotation With Pillow: Transverse Plane Stability   One knee bent, one leg straight, on pillow. Slowly roll bent knee out. Be sure pelvis does not rotate. Do _10__ times. Restabilize pelvis. Repeat with other leg. Do _1-2__ sets, _1__ times per day.   [facility] [address] [place],  [address]  [phone] (office) [phone] (fax)   

## 2016-09-07 NOTE — Therapy (Signed)
[place], [place], [address] Phone: [phone]   Fax:  [phone]  Physical Therapy Treatment  Patient Details  Name: [name] MRN: [id number] Date of Birth: 12/18/1965 Referring Provider: Dr. Kristeen Miss  Encounter Date: 09/07/2016      PT End of Session - 09/07/16 0938    Visit Number 19   Number of Visits 26  added per supervising PT, Isabelle Course   Date for PT Re-Evaluation 09/30/16   PT Start Time 0930   PT Stop Time 1025   PT Time Calculation (min) 55 min   Activity Tolerance Patient tolerated treatment well   Behavior During Therapy [facility] for tasks assessed/performed      Past Medical History:  Diagnosis Date  . Cancer ([place]) 1990   Cervical  . Carpal tunnel syndrome, bilateral   . Complication of anesthesia INTRAOP HYPERTENSION W/ TONSILLECTOMY SURGERY AGE 79--  NO ISSUES SINCE  . Left knee pain     Past Surgical History:  Procedure Laterality Date  . ANTERIOR CERVICAL DECOMP/DISCECTOMY FUSION  10-16-2010   C4  -  C6  . CERVICAL BIOPSY  W/ LOOP ELECTRODE EXCISION  2010  . CESAREAN SECTION  1987  . CHOLECYSTECTOMY  2009  . FOOT SURGERY  2010   LEFT  . KNEE ARTHROSCOPY  10/20/2012   Procedure: ARTHROSCOPY KNEE;  Surgeon: Mauri Pole, MD;  Location: [facility];  Service: Orthopedics;  Laterality: Left;  medial menisectomy medial patellar condroplasty and sinovectomy  . TONSILLECTOMY  AGE 79  . TRANSTHORACIC ECHOCARDIOGRAM  06-20-2010   NORMAL LVSF/ EF 55%  . TUBAL LIGATION  1988    There were no vitals filed for this visit.          [facility] PT Assessment - 09/07/16 0001      Assessment   Medical Diagnosis low back pain/cervical disc fusion   Referring Provider Dr. Kristeen Miss     Precautions   Precaution Comments bulging disc on Lt low back          [facility] Adult PT Treatment/Exercise - 09/07/16 0001      Lumbar Exercises: Stretches    Passive Hamstring Stretch 2 reps;30 seconds   Lower Trunk Rotation 2 reps;10 seconds  gentle, to tolerance   Piriformis Stretch 2 reps;30 seconds     Lumbar Exercises: Aerobic   Stationary Bike NuStep L3: arms/legs x 6 min      Lumbar Exercises: Seated   Sit to Stand 5 reps  2 sets; core engaged     Lumbar Exercises: Supine   Ab Set 5 reps;5 seconds   Clam 10 reps  with ab set   Bent Knee Raise 10 reps  with ab set   Bridge 10 reps     Shoulder Exercises: Supine   Other Supine Exercises 10 reps shoulder presses with thoracic lifts (5 sec hold); unilateral snow angels x 10 reps each side (unable to tolerate bilat)     Modalities   Modalities Traction     Moist Heat Therapy   Number Minutes Moist Heat 15 Minutes   Moist Heat Location Cervical     Traction   Type of Traction Lumbar  pt wt 245#    Min (lbs) 45   Kerri (lbs) 60   Hold Time 60   Rest Time 20   Time 15                PT Education -  09/07/16 1001    Education provided Yes   Education Details HEP    Person(s) Educated Patient   Methods Explanation;Verbal cues;Handout   Comprehension Returned demonstration;Verbalized understanding             PT Long Term Goals - 09/02/16 1037      PT LONG TERM GOAL #1   Title Improve posture and alignment allowing patient to demonstrate upright posture with good position of head/neck/shoulders 08/31/16   Status Achieved     PT LONG TERM GOAL #2   Title Imrprove cervical and shoulder ROM to WFL's throughtout 08/31/16   Status On-going     PT LONG TERM GOAL #3   Title Patient reports that she is able to sleep for 6 or more hours per night 08/31/16   Status On-going     PT LONG TERM GOAL #4   Title Independent in HEP 08/31/16   Status On-going     PT LONG TERM GOAL #5   Title Improve FOTO to </= 54% limitation 08/31/16   Status On-going     Additional Long Term Goals   Additional Long Term Goals Yes     PT LONG TERM GOAL #6   Title Pt will  be able to sit x 30 minutes with low back pain <= 3/10   Time 4   Period Weeks   Status New     PT LONG TERM GOAL #7   Title Pt will demo bilat LE strength 4+/5 to improve functional mobility   Time 4   Period Weeks   Status New               Plan - 09/07/16 [id number]    Clinical Impression Statement Pt had difficulty tolerating the amount of pull of lumbar traction last session.  Requests to trial traction at lower pull.  Pt tolerated all exercises well, with minimal increase in pain.     Rehab Potential Good   PT Frequency 2x / week   PT Duration 4 weeks   PT Treatment/Interventions Patient/family education;ADLs/Self Care Home Management;Neuromuscular re-education;Cryotherapy;Electrical Stimulation;Iontophoresis 4mg /ml Dexamethasone;Moist Heat;Traction;Ultrasound;Manual techniques;Dry needling;Therapeutic activities;Therapeutic exercise;Taping;Balance training;Gait training;Functional mobility training;Passive range of motion   PT Next Visit Plan assess response to traction. continue spinal stabilization. manual/dry needle to low back as tolerated   Consulted and Agree with Plan of Care Patient      Patient will benefit from skilled therapeutic intervention in order to improve the following deficits and impairments:  Postural dysfunction, Improper body mechanics, Pain, Decreased range of motion, Decreased mobility, Increased fascial restricitons, Increased muscle spasms, Decreased activity tolerance, Decreased strength, Decreased endurance, Difficulty walking  Visit Diagnosis: Chronic bilateral low back pain without sciatica  Cervicalgia  Other symptoms and signs involving the musculoskeletal system  Abnormal posture     Problem List Patient Active Problem List   Diagnosis Date Noted  . S/P left knee arthroscopy 10/20/2012   Kerin Perna, PTA 09/07/16 10:14 AM  [place], [place], [address] Phone: [phone]   Fax:  [phone]  Name: [name] MRN: [id number] Date of Birth: 1966/04/11

## 2016-09-10 ENCOUNTER — Ambulatory Visit (INDEPENDENT_AMBULATORY_CARE_PROVIDER_SITE_OTHER): Payer: Managed Care, Other (non HMO) | Admitting: Physical Therapy

## 2016-09-10 DIAGNOSIS — G8929 Other chronic pain: Secondary | ICD-10-CM

## 2016-09-10 DIAGNOSIS — R293 Abnormal posture: Secondary | ICD-10-CM

## 2016-09-10 DIAGNOSIS — R29898 Other symptoms and signs involving the musculoskeletal system: Secondary | ICD-10-CM | POA: Diagnosis not present

## 2016-09-10 DIAGNOSIS — M545 Low back pain, unspecified: Secondary | ICD-10-CM

## 2016-09-10 DIAGNOSIS — M542 Cervicalgia: Secondary | ICD-10-CM | POA: Diagnosis not present

## 2016-09-10 NOTE — Therapy (Signed)
[place], [place], [address] Phone: [phone]   Fax:  [phone]  Physical Therapy Treatment  Patient Details  Name: [name] MRN: [id number] Date of Birth: 10-Jul-1966 Referring Provider: Dr. Kristeen Miss  Encounter Date: 09/10/2016      PT End of Session - 09/10/16 0937    Visit Number 20   Number of Visits 26   Date for PT Re-Evaluation 09/30/16   PT Start Time 0934   PT Stop Time 1034   PT Time Calculation (min) 60 min      Past Medical History:  Diagnosis Date  . Cancer ([place]) 1990   Cervical  . Carpal tunnel syndrome, bilateral   . Complication of anesthesia INTRAOP HYPERTENSION W/ TONSILLECTOMY SURGERY AGE 22--  NO ISSUES SINCE  . Left knee pain     Past Surgical History:  Procedure Laterality Date  . ANTERIOR CERVICAL DECOMP/DISCECTOMY FUSION  10-16-2010   C4  -  C6  . CERVICAL BIOPSY  W/ LOOP ELECTRODE EXCISION  2010  . CESAREAN SECTION  1987  . CHOLECYSTECTOMY  2009  . FOOT SURGERY  2010   LEFT  . KNEE ARTHROSCOPY  10/20/2012   Procedure: ARTHROSCOPY KNEE;  Surgeon: Mauri Pole, MD;  Location: [facility];  Service: Orthopedics;  Laterality: Left;  medial menisectomy medial patellar condroplasty and sinovectomy  . TONSILLECTOMY  AGE 22  . TRANSTHORACIC ECHOCARDIOGRAM  06-20-2010   NORMAL LVSF/ EF 55%  . TUBAL LIGATION  1988    There were no vitals filed for this visit.      Subjective Assessment - 09/10/16 0937    Subjective Pt states she tolerated lumbar traction well.  She sat a long time at hospital with family member and noticed tingling down LUE.  "I feel like a football player again" (tightness in upper trap on Lt)   Currently in Pain? Yes   Pain Score 8    Pain Location Neck   Pain Orientation Left   Pain Descriptors / Indicators Sharp   Aggravating Factors  sitting (prolonged)   Pain Relieving Factors heat             [facility] PT  Assessment - 09/10/16 0001      Assessment   Medical Diagnosis low back pain/cervical disc fusion   Referring Provider Dr. Kristeen Miss   Onset Date/Surgical Date 03/20/16   Hand Dominance Right   Next MD Visit 10/08/2016     Precautions   Precaution Comments bulging disc on Lt low back     AROM   Cervical Flexion 37   Cervical Extension 30   Cervical - Right Side Bend 20   Cervical - Left Side Bend 20   Cervical - Right Rotation 40   Cervical - Left Rotation 32   Lumbar - Right Side Bend 18   Lumbar - Left Side Bend 25   Lumbar - Right Rotation 35   Lumbar - Left Rotation 22         [facility] Adult PT Treatment/Exercise - 09/10/16 0001      Lumbar Exercises: Stretches   Passive Hamstring Stretch 2 reps;30 seconds  each side, leg supported on PTA.    Passive Hamstring Stretch Limitations (very tight hamstrings bilat)   Lower Trunk Rotation --  gentle, to tolerance- 30 sec rocking   Piriformis Stretch 1 rep;20 seconds  each side.     Lumbar Exercises: Aerobic   Stationary Bike NuStep  L4: arms/legs x 6 min      Lumbar Exercises: Supine   Ab Set --  3 reps, 5 sec hold.  Pt began cramping in low back.    Bridge 5 reps  pt reported increased cramping in low back; stopped.      Shoulder Exercises: Supine   Other Supine Exercises 10 reps unilateral snow angels.      Moist Heat Therapy   Number Minutes Moist Heat 15 Minutes   Moist Heat Location Cervical     Electrical Stimulation   Electrical Stimulation Location cervical paraspinals/ upper trap    Electrical Stimulation Action IFC   Electrical Stimulation Parameters to tolerance    Electrical Stimulation Goals Tone;Pain     Traction   Type of Traction Lumbar   Min (lbs) 45   Max (lbs) 60   Hold Time 60   Rest Time 20   Time 20     Neck Exercises: Stretches   Other Neck Stretches  doorway stretch 2 reps 20 sec hold, low and mid level (unilateral0                     PT Long Term Goals -  09/02/16 1037      PT LONG TERM GOAL #1   Title Improve posture and alignment allowing patient to demonstrate upright posture with good position of head/neck/shoulders 08/31/16   Status Achieved     PT LONG TERM GOAL #2   Title Imrprove cervical and shoulder ROM to WFL's throughtout 08/31/16   Status On-going     PT LONG TERM GOAL #3   Title Patient reports that she is able to sleep for 6 or more hours per night 08/31/16   Status On-going     PT LONG TERM GOAL #4   Title Independent in HEP 08/31/16   Status On-going     PT LONG TERM GOAL #5   Title Improve FOTO to </= 54% limitation 08/31/16   Status On-going     Additional Long Term Goals   Additional Long Term Goals Yes     PT LONG TERM GOAL #6   Title Pt will be able to sit x 30 minutes with low back pain <= 3/10   Time 4   Period Weeks   Status New     PT LONG TERM GOAL #7   Title Pt will demo bilat LE strength 4+/5 to improve functional mobility   Time 4   Period Weeks   Status New               Plan - 09/10/16 1329    Clinical Impression Statement Pt tolerated decreased pull of traction last session much better than inital session.  Pt demonstrated some improvement in spinal ROM.  Pt began to spasm throughout during measurements.  Tolerance to exercise was poor after measurements, due to increased pain.  Overall pt making good gains towards remaining goals.    Rehab Potential Good   PT Frequency 2x / week   PT Duration 4 weeks   PT Treatment/Interventions Patient/family education;ADLs/Self Care Home Management;Neuromuscular re-education;Cryotherapy;Electrical Stimulation;Iontophoresis 4mg /ml Dexamethasone;Moist Heat;Traction;Ultrasound;Manual techniques;Dry needling;Therapeutic activities;Therapeutic exercise;Taping;Balance training;Gait training;Functional mobility training;Passive range of motion   PT Next Visit Plan continue spinal stabilization. manual/dry needle to low back as tolerated   Consulted and  Agree with Plan of Care Patient      Patient will benefit from skilled therapeutic intervention in order to improve the following deficits and impairments:  Postural  dysfunction, Improper body mechanics, Pain, Decreased range of motion, Decreased mobility, Increased fascial restricitons, Increased muscle spasms, Decreased activity tolerance, Decreased strength, Decreased endurance, Difficulty walking  Visit Diagnosis: Chronic bilateral low back pain without sciatica  Cervicalgia  Other symptoms and signs involving the musculoskeletal system  Abnormal posture     Problem List Patient Active Problem List   Diagnosis Date Noted  . S/P left knee arthroscopy 10/20/2012   Kerin Perna, PTA 09/10/16 1:39 PM  [place], [place], [address] Phone: [phone]   Fax:  [phone]  Name: [name] MRN: [id number] Date of Birth: 08-29-1966

## 2016-09-14 ENCOUNTER — Ambulatory Visit (INDEPENDENT_AMBULATORY_CARE_PROVIDER_SITE_OTHER): Payer: Managed Care, Other (non HMO) | Admitting: Physical Therapy

## 2016-09-14 DIAGNOSIS — R29898 Other symptoms and signs involving the musculoskeletal system: Secondary | ICD-10-CM | POA: Diagnosis not present

## 2016-09-14 DIAGNOSIS — M545 Low back pain, unspecified: Secondary | ICD-10-CM

## 2016-09-14 DIAGNOSIS — M542 Cervicalgia: Secondary | ICD-10-CM

## 2016-09-14 DIAGNOSIS — R293 Abnormal posture: Secondary | ICD-10-CM

## 2016-09-14 DIAGNOSIS — G8929 Other chronic pain: Secondary | ICD-10-CM

## 2016-09-14 NOTE — Therapy (Signed)
[place], [place], [address] Phone: [phone]   Fax:  [phone]  Physical Therapy Treatment  Patient Details  Name: [name] MRN: [id number] Date of Birth: 03/11/1966 Referring Provider: Dr. Kristeen Miss   Encounter Date: 09/14/2016      PT End of Session - 09/14/16 1122    Visit Number 21   Number of Visits 26   Date for PT Re-Evaluation 09/30/16   PT Start Time 1107  pt arrived late   PT Stop Time 1159   PT Time Calculation (min) 52 min      Past Medical History:  Diagnosis Date  . Cancer ([place]) 1990   Cervical  . Carpal tunnel syndrome, bilateral   . Complication of anesthesia INTRAOP HYPERTENSION W/ TONSILLECTOMY SURGERY AGE 21--  NO ISSUES SINCE  . Left knee pain     Past Surgical History:  Procedure Laterality Date  . ANTERIOR CERVICAL DECOMP/DISCECTOMY FUSION  10-16-2010   C4  -  C6  . CERVICAL BIOPSY  W/ LOOP ELECTRODE EXCISION  2010  . CESAREAN SECTION  1987  . CHOLECYSTECTOMY  2009  . FOOT SURGERY  2010   LEFT  . KNEE ARTHROSCOPY  10/20/2012   Procedure: ARTHROSCOPY KNEE;  Surgeon: Mauri Pole, MD;  Location: [facility];  Service: Orthopedics;  Laterality: Left;  medial menisectomy medial patellar condroplasty and sinovectomy  . TONSILLECTOMY  AGE 21  . TRANSTHORACIC ECHOCARDIOGRAM  06-20-2010   NORMAL LVSF/ EF 55%  . TUBAL LIGATION  1988    There were no vitals filed for this visit.      Subjective Assessment - 09/14/16 1113    Subjective Pt reports she is not getting good sleep any more.  Has had increased pain since prior to last session.  She requests to have TDN again.    Currently in Pain? Yes   Pain Score 10-Worst pain ever   Pain Location Neck   Pain Orientation Left;Right   Aggravating Factors  sitting    Pain Relieving Factors heat, estim   Pain Score 9   Pain Location Back   Pain Orientation Right;Lower   Aggravating  Factors  standing, sitting     Pain Relieving Factors ice, TENS, traction             [facility] PT Assessment - 09/14/16 0001      Assessment   Medical Diagnosis low back pain/cervical disc fusion   Referring Provider Dr. Kristeen Miss    Onset Date/Surgical Date 03/20/16   Hand Dominance Right   Next MD Visit 10/08/2016            [facility] Adult PT Treatment/Exercise - 09/14/16 0001      Neck Exercises: Supine   Other Supine Exercise unilateral snow angel arm to tolerance x 10 reps.      Lumbar Exercises: Aerobic   Stationary Bike NuStep L4: arms/legs x 4.5 min      Lumbar Exercises: Standing   Other Standing Lumbar Exercises side stretch to Lt with hand on wall x 20 sec x 3 reps      Lumbar Exercises: Seated   Sit to Stand 5 reps  2 sets; core engaged     Lumbar Exercises: Supine   Ab Set 5 reps;5 seconds   Clam 10 reps  with ab set   Bent Knee Raise 5 reps  with ab set, each leg   Other Supine Lumbar Exercises lower trunk  rotation 2 x 30 sec in pain free range     Shoulder Exercises: Supine   Other Supine Exercises 10 reps scap squeeze x 5 sec hold.       Moist Heat Therapy   Number Minutes Moist Heat 15 Minutes   Moist Heat Location Cervical     Cryotherapy   Number Minutes Cryotherapy 15 Minutes   Cryotherapy Location Lumbar Spine   Type of Cryotherapy Ice pack     Electrical Stimulation   Electrical Stimulation Location cervical and lumbar paraspinals    Electrical Stimulation Action premod to each area   Electrical Stimulation Parameters  to tolerance    Electrical Stimulation Goals Tone;Pain     Manual Therapy   Soft tissue mobilization Rt/Lt cervical paraspinals, levator, upper trap    Myofascial Release suboccipital release x 4 reps                      PT Long Term Goals - 09/14/16 1122      PT LONG TERM GOAL #3   Title Patient reports that she is able to sleep for 6 or more hours per night 08/31/16   Time 4   Period Weeks    Status On-going  Pt reports 2-3 hours of sleep/night      PT LONG TERM GOAL #4   Title Independent in HEP 08/31/16   Time 4   Period Weeks   Status On-going     PT LONG TERM GOAL #5   Title Improve FOTO to </= 54% limitation 08/31/16   Time 4   Period Weeks   Status On-going     PT LONG TERM GOAL #6   Title Pt will be able to sit x 30 minutes with low back pain <= 3/10   Time 4   Period Weeks   Status On-going  able to sit 15 min with support     PT LONG TERM GOAL #7   Title Pt will demo bilat LE strength 4+/5 to improve functional mobility   Time 4   Period Weeks   Status On-going               Plan - 09/14/16 1147    Clinical Impression Statement Continued limited tolerance for ther ex due to increased pain level.  Pt reported significant reduction of pain with decompression position, 0/10 in low back and 5/10 in neck. Pt encouraged to do this more often at home.  Traction was not available this visit, but we plan to do it again next visit.  Pain is a limiting barrier to progress.    Rehab Potential Good   PT Frequency 2x / week   PT Duration 4 weeks   PT Next Visit Plan continue spinal stabilization. manual/dry needle to neck/low back as tolerated   Consulted and Agree with Plan of Care Patient      Patient will benefit from skilled therapeutic intervention in order to improve the following deficits and impairments:  Postural dysfunction, Improper body mechanics, Pain, Decreased range of motion, Decreased mobility, Increased fascial restricitons, Increased muscle spasms, Decreased activity tolerance, Decreased strength, Decreased endurance, Difficulty walking  Visit Diagnosis: Chronic bilateral low back pain without sciatica  Cervicalgia  Other symptoms and signs involving the musculoskeletal system  Abnormal posture     Problem List Patient Active Problem List   Diagnosis Date Noted  . S/P left knee arthroscopy 10/20/2012   Kerin Perna,  PTA 09/14/16 11:53 AM  [facility] [place], [place], [address] Phone: [phone]   Fax:  [phone]  Name: [name] MRN: [id number] Date of Birth: 03-03-1966

## 2016-09-18 ENCOUNTER — Encounter: Payer: Self-pay | Admitting: Physical Therapy

## 2016-09-18 ENCOUNTER — Encounter: Payer: Managed Care, Other (non HMO) | Admitting: Physical Therapy

## 2016-09-21 ENCOUNTER — Encounter: Payer: Self-pay | Admitting: Rehabilitative and Restorative Service Providers"

## 2016-09-21 ENCOUNTER — Ambulatory Visit (INDEPENDENT_AMBULATORY_CARE_PROVIDER_SITE_OTHER): Payer: Managed Care, Other (non HMO) | Admitting: Rehabilitative and Restorative Service Providers"

## 2016-09-21 DIAGNOSIS — M545 Low back pain, unspecified: Secondary | ICD-10-CM

## 2016-09-21 DIAGNOSIS — R29898 Other symptoms and signs involving the musculoskeletal system: Secondary | ICD-10-CM

## 2016-09-21 DIAGNOSIS — R293 Abnormal posture: Secondary | ICD-10-CM | POA: Diagnosis not present

## 2016-09-21 DIAGNOSIS — M542 Cervicalgia: Secondary | ICD-10-CM

## 2016-09-21 DIAGNOSIS — G8929 Other chronic pain: Secondary | ICD-10-CM

## 2016-09-21 NOTE — Therapy (Signed)
[place], [place], [address] Phone: [phone]   Fax:  [phone]  Physical Therapy Treatment  Patient Details  Name: [name] MRN: [id number] Date of Birth: Jun 30, 1966 Referring Provider: Dr. Kristeen Miss   Encounter Date: 09/21/2016      PT End of Session - 09/21/16 1017    Visit Number 22   Number of Visits 26   Date for PT Re-Evaluation 09/30/16   PT Start Time 1017   PT Stop Time 1115   PT Time Calculation (min) 58 min   Activity Tolerance Patient tolerated treatment well      Past Medical History:  Diagnosis Date  . Cancer ([place]) 1990   Cervical  . Carpal tunnel syndrome, bilateral   . Complication of anesthesia INTRAOP HYPERTENSION W/ TONSILLECTOMY SURGERY AGE 50--  NO ISSUES SINCE  . Left knee pain     Past Surgical History:  Procedure Laterality Date  . ANTERIOR CERVICAL DECOMP/DISCECTOMY FUSION  10-16-2010   C4  -  C6  . CERVICAL BIOPSY  W/ LOOP ELECTRODE EXCISION  2010  . CESAREAN SECTION  1987  . CHOLECYSTECTOMY  2009  . FOOT SURGERY  2010   LEFT  . KNEE ARTHROSCOPY  10/20/2012   Procedure: ARTHROSCOPY KNEE;  Surgeon: Mauri Pole, MD;  Location: [facility];  Service: Orthopedics;  Laterality: Left;  medial menisectomy medial patellar condroplasty and sinovectomy  . TONSILLECTOMY  AGE 50  . TRANSTHORACIC ECHOCARDIOGRAM  06-20-2010   NORMAL LVSF/ EF 55%  . TUBAL LIGATION  1988    There were no vitals filed for this visit.      Subjective Assessment - 09/21/16 1018    Subjective Neck is bothering her a lot more - unable to sleep; hurts when she is driving   Currently in Pain? Yes   Pain Score 8    Pain Location Neck   Pain Orientation Left;Right   Pain Descriptors / Indicators Heaviness   Pain Score 9   Pain Location Back   Pain Orientation Right;Lower   Pain Descriptors / Indicators Stabbing   Pain Type Chronic pain   Pain Onset More  than a month ago                         [facility] Adult PT Treatment/Exercise - 09/21/16 0001      Neck Exercises: Supine   Neck Retraction 5 reps;10 secs     Lumbar Exercises: Aerobic   Stationary Bike NuStep L4: arms(7)/legs x 4 min      Moist Heat Therapy   Number Minutes Moist Heat 20 Minutes   Moist Heat Location Cervical;Shoulder     Cryotherapy   Number Minutes Cryotherapy 20 Minutes   Cryotherapy Location Lumbar Spine   Type of Cryotherapy Ice pack     Electrical Stimulation   Electrical Stimulation Location cervivcal/upper trap bilat    Electrical Stimulation Action IFC   Electrical Stimulation Parameters to tolerance   Electrical Stimulation Goals Tone;Pain     Manual Therapy   Manual therapy comments pt prone   Soft tissue mobilization Rt/Lt cervical paraspinals, levator, upper trap           Trigger Point Dry Needling - 09/21/16 1100    Consent Given? Yes   Upper Trapezius Response Twitch reponse elicited;Palpable increased muscle length   SubOccipitals Response Palpable increased muscle length   Longissimus Response Palpable increased muscle length  cervical               PT Education - 09/21/16 1101    Education provided Yes   Education Details encouraged TENS and HEP    Person(s) Educated Patient   Methods Explanation   Comprehension Verbalized understanding             PT Long Term Goals - 09/21/16 1103      PT LONG TERM GOAL #1   Title Improve posture and alignment allowing patient to demonstrate upright posture with good position of head/neck/shoulders 08/31/16   Time 4   Period Weeks   Status On-going     PT LONG TERM GOAL #2   Title Imrprove cervical and shoulder ROM to WFL's throughtout 08/31/16   Time 4   Period Weeks   Status On-going     PT LONG TERM GOAL #3   Title Patient reports that she is able to sleep for 6 or more hours per night 08/31/16   Time 4   Period Weeks   Status On-going     PT  LONG TERM GOAL #4   Title Independent in HEP 08/31/16   Time 4   Period Weeks   Status On-going     PT LONG TERM GOAL #5   Title Improve FOTO to </= 54% limitation 08/31/16   Time 4   Period Weeks   Status On-going     PT LONG TERM GOAL #6   Title Pt will be able to sit x 30 minutes with low back pain <= 3/10   Time 4   Period Weeks   Status On-going     PT LONG TERM GOAL #7   Title Pt will demo bilat LE strength 4+/5 to improve functional mobility   Time 4   Period Weeks   Status On-going               Plan - 09/21/16 1101    Clinical Impression Statement Muscular tightness noted through bilat cervical and upper trap musculature. Good response to TDn and manual work with less palpable tightness and impeoved tissue extensibility.    Rehab Potential Good   PT Frequency 2x / week   PT Duration 4 weeks   PT Treatment/Interventions Patient/family education;ADLs/Self Care Home Management;Neuromuscular re-education;Cryotherapy;Electrical Stimulation;Iontophoresis 4mg /ml Dexamethasone;Moist Heat;Traction;Ultrasound;Manual techniques;Dry needling;Therapeutic activities;Therapeutic exercise;Taping;Balance training;Gait training;Functional mobility training;Passive range of motion   PT Next Visit Plan continue spinal stabilization. manual/dry needle to neck/low back as tolerated   Consulted and Agree with Plan of Care Patient      Patient will benefit from skilled therapeutic intervention in order to improve the following deficits and impairments:  Postural dysfunction, Improper body mechanics, Pain, Decreased range of motion, Decreased mobility, Increased fascial restricitons, Increased muscle spasms, Decreased activity tolerance, Decreased strength, Decreased endurance, Difficulty walking  Visit Diagnosis: Chronic bilateral low back pain without sciatica  Cervicalgia  Other symptoms and signs involving the musculoskeletal system  Abnormal posture     Problem  List Patient Active Problem List   Diagnosis Date Noted  . S/P left knee arthroscopy 10/20/2012    [name]  PT,  MPH  09/21/2016, 11:04 AM  [facility] [place], [place], [address] Phone: [phone]   Fax:  [phone]  Name: [name] MRN: [id number] Date of Birth: 1966-04-01

## 2016-09-28 ENCOUNTER — Encounter: Payer: Self-pay | Admitting: Physical Therapy

## 2016-10-01 ENCOUNTER — Encounter: Payer: Self-pay | Admitting: Physical Therapy

## 2016-10-01 ENCOUNTER — Ambulatory Visit (INDEPENDENT_AMBULATORY_CARE_PROVIDER_SITE_OTHER): Payer: Managed Care, Other (non HMO) | Admitting: Physical Therapy

## 2016-10-01 DIAGNOSIS — M545 Low back pain, unspecified: Secondary | ICD-10-CM

## 2016-10-01 DIAGNOSIS — R293 Abnormal posture: Secondary | ICD-10-CM | POA: Diagnosis not present

## 2016-10-01 DIAGNOSIS — R29898 Other symptoms and signs involving the musculoskeletal system: Secondary | ICD-10-CM | POA: Diagnosis not present

## 2016-10-01 DIAGNOSIS — G8929 Other chronic pain: Secondary | ICD-10-CM

## 2016-10-01 DIAGNOSIS — M542 Cervicalgia: Secondary | ICD-10-CM

## 2016-10-01 NOTE — Therapy (Signed)
[place], [place], [address] Phone: [phone]   Fax:  [phone]  Physical Therapy Treatment  Patient Details  Name: [name] MRN: [id number] Date of Birth: 1966-06-15 Referring Provider: Dr. Kristeen Miss   Encounter Date: 10/01/2016      PT End of Session - 10/01/16 0908    Visit Number 23   Number of Visits 26   Date for PT Re-Evaluation 09/30/16   PT Start Time 0850   PT Stop Time 0956   PT Time Calculation (min) 66 min   Activity Tolerance Patient tolerated treatment well   Behavior During Therapy [facility] for tasks assessed/performed      Past Medical History:  Diagnosis Date  . Cancer ([place]) 1990   Cervical  . Carpal tunnel syndrome, bilateral   . Complication of anesthesia INTRAOP HYPERTENSION W/ TONSILLECTOMY SURGERY AGE 57--  NO ISSUES SINCE  . Left knee pain     Past Surgical History:  Procedure Laterality Date  . ANTERIOR CERVICAL DECOMP/DISCECTOMY FUSION  10-16-2010   C4  -  C6  . CERVICAL BIOPSY  W/ LOOP ELECTRODE EXCISION  2010  . CESAREAN SECTION  1987  . CHOLECYSTECTOMY  2009  . FOOT SURGERY  2010   LEFT  . KNEE ARTHROSCOPY  10/20/2012   Procedure: ARTHROSCOPY KNEE;  Surgeon: Mauri Pole, MD;  Location: [facility];  Service: Orthopedics;  Laterality: Left;  medial menisectomy medial patellar condroplasty and sinovectomy  . TONSILLECTOMY  AGE 57  . TRANSTHORACIC ECHOCARDIOGRAM  06-20-2010   NORMAL LVSF/ EF 55%  . TUBAL LIGATION  1988    There were no vitals filed for this visit.      Subjective Assessment - 10/01/16 0908    Subjective Pt reports her low back has been ok, as long as she lays on her Lt side.  She is only able to sleep 2 hrs at a time.  Turning neck to look when driving has been painful.  She has been getting charlie horses in her legs for a week now.     Currently in Pain? Yes   Pain Score 7    Pain Location Neck   Pain  Orientation Left;Right   Pain Descriptors / Indicators Heaviness;Sharp   Aggravating Factors  prolonged sitting    Pain Relieving Factors heat/ice, estim             [facility] PT Assessment - 10/01/16 0001      Assessment   Medical Diagnosis low back pain/cervical disc fusion   Referring Provider Dr. Kristeen Miss    Onset Date/Surgical Date 03/20/16   Hand Dominance Right   Next MD Visit 10/08/2016     AROM   Right/Left Shoulder Right;Left   Right Shoulder Extension 30 Degrees   Right Shoulder Flexion 160 Degrees  supine   Right Shoulder External Rotation 70 Degrees  supine, abd to 90   Left Shoulder Extension 30 Degrees   Left Shoulder Flexion 150 Degrees  supine, pain and tingles in hand at end range   Left Shoulder External Rotation 35 Degrees  supine, abd to 90 deg.  painful   Cervical Flexion 22  with pain   Cervical Extension 15  with pain    Cervical - Right Side Bend 27   Cervical - Left Side Bend 25   Cervical - Right Rotation 52   Cervical - Left Rotation 35   Lumbar Flexion 50   Lumbar Extension  20   Lumbar - Right Side Bend 25   Lumbar - Left Side Bend 25   Lumbar - Right Rotation 35   Lumbar - Left Rotation 22           [facility] Adult PT Treatment/Exercise - 10/01/16 0001      Neck Exercises: Seated   Other Seated Exercise modified lat stretch with arms on table x 20 sec x 4 reps ; repeated with lateral trunk flexion x 2 reps x 20 sec each side.      Neck Exercises: Supine   Other Supine Exercise scap squeeze x 5 sec x 10 reps   Other Supine Exercise unilateral snow angel arm to tolerance x 10 reps.      Lumbar Exercises: Aerobic   Stationary Bike NuStep L4: arms(7)/legs x 6 min      Lumbar Exercises: Standing   Other Standing Lumbar Exercises Gastroc stretch with heels off step x 30 sec x 2 reps each leg.      Moist Heat Therapy   Number Minutes Moist Heat 20 Minutes   Moist Heat Location Cervical     Cryotherapy   Number Minutes Cryotherapy  20 Minutes   Cryotherapy Location Lumbar Spine   Type of Cryotherapy Ice pack     Electrical Stimulation   Electrical Stimulation Location cervivcal/upper trap bilat    Electrical Stimulation Action IFC   Electrical Stimulation Parameters to tolerance    Electrical Stimulation Goals Tone;Pain           PT Long Term Goals - 10/01/16 0944      PT LONG TERM GOAL #1   Title Improve posture and alignment allowing patient to demonstrate upright posture with good position of head/neck/shoulders 08/31/16   Time 4   Period Weeks   Status Achieved     PT LONG TERM GOAL #2   Title Improve cervical and shoulder ROM to WFL's throughtout 08/31/16   Time 4   Period Weeks   Status Partially Met  shoulders improved, cervical continues to be limited     PT LONG TERM GOAL #3   Title Patient reports that she is able to sleep for 6 or more hours per night 08/31/16   Time 4   Status Not Met     PT LONG TERM GOAL #4   Title Independent in HEP 08/31/16   Time 4   Period Weeks   Status Achieved     PT LONG TERM GOAL #5   Title Improve FOTO to </= 54% limitation 08/31/16   Time 4   Period Weeks   Status Unable to assess  did not capture during last visit.      PT LONG TERM GOAL #6   Title Pt will be able to sit x 30 minutes with low back pain <= 3/10   Time 4   Period Weeks   Status Not Met  pain up to 7/10 with sitting 30 min.      PT LONG TERM GOAL #7   Title Pt will demo bilat LE strength 4+/5 to improve functional mobility   Status Unable to assess  not tested this date               Plan - 10/01/16 0949    Clinical Impression Statement Pt has had some improvement in cervical and lumbar ROM.  She continues to be limited with standing/ sitting greater than 10-15 min due to increased pain. This has limited her ability to complete  her ADLs requiring increased time and occasional assistance from another.  She has partially met her goals.  She feels ready to perform HEP on  own until she returns to MD.    Rehab Potential Good   PT Frequency 2x / week   PT Duration 4 weeks   PT Treatment/Interventions Patient/family education;ADLs/Self Care Home Management;Neuromuscular re-education;Cryotherapy;Electrical Stimulation;Iontophoresis 40m/ml Dexamethasone;Moist Heat;Traction;Ultrasound;Manual techniques;Dry needling;Therapeutic activities;Therapeutic exercise;Taping;Balance training;Gait training;Functional mobility training;Passive range of motion   PT Next Visit Plan Spoke to supervising PT. Will d/c to HEP per pt request.     Consulted and Agree with Plan of Care Patient      Patient will benefit from skilled therapeutic intervention in order to improve the following deficits and impairments:  Postural dysfunction, Improper body mechanics, Pain, Decreased range of motion, Decreased mobility, Increased fascial restricitons, Increased muscle spasms, Decreased activity tolerance, Decreased strength, Decreased endurance, Difficulty walking  Visit Diagnosis: Chronic bilateral low back pain without sciatica  Cervicalgia  Other symptoms and signs involving the musculoskeletal system  Abnormal posture     Problem List Patient Active Problem List   Diagnosis Date Noted  . S/P left knee arthroscopy 10/20/2012   JKerin Perna PTA 10/01/16 1:05 PM  [facility] [address] [address] [address]Phone: [phone]  Fax:  [phone] Name: Kerri VALLADARESMRN: [id number] of Birth: 208-04-1966 PHYSICAL THERAPY DISCHARGE SUMMARY  Visits from Start of Care: 23  Current functional level related to goals / functional outcomes: See above   Remaining deficits: See above   Education / Equipment: HEP Plan: Patient agrees to discharge.  Patient goals were partially met. Patient is being discharged due to the patient's request.  ?????Patient has not progressed in the last couple of  weeks.  She has her HEP and will continue to perform that at home.   SJeral Pinch PT 10/01/16 3:33 PM

## 2016-10-05 ENCOUNTER — Encounter: Payer: Self-pay | Admitting: Rehabilitative and Restorative Service Providers"

## 2017-03-22 ENCOUNTER — Other Ambulatory Visit (HOSPITAL_COMMUNITY): Payer: Self-pay | Admitting: Surgery

## 2017-04-07 ENCOUNTER — Encounter: Payer: Self-pay | Admitting: Skilled Nursing Facility1

## 2017-04-07 ENCOUNTER — Encounter: Payer: BLUE CROSS/BLUE SHIELD | Attending: Surgery | Admitting: Skilled Nursing Facility1

## 2017-04-07 DIAGNOSIS — Z713 Dietary counseling and surveillance: Secondary | ICD-10-CM | POA: Diagnosis present

## 2017-04-07 DIAGNOSIS — E669 Obesity, unspecified: Secondary | ICD-10-CM

## 2017-04-07 NOTE — Patient Instructions (Addendum)
Follow Pre-Op Goals Try Protein Shakes Call [facility] at [phone] when surgery is scheduled to enroll in Pre-Op Class  Things to remember:  Please always be honest with [place]. We want to support you!  If you have any questions or concerns in between appointments, please call or email   The diet after surgery will be high protein and low in carbohydrate.  Vitamins and calcium need to be taken for the rest of your life.  Feel free to include support people in any classes or appointments.   Supplement recommendations:  Before Surgery   1 Complete Multivitamin with Iron  3000 IU Vitamin D3  After Surgery   2 Chewable Multivitamins  **Best Choice - Opurity: Bypass and Sleeve Optimized Chewable    Bariatric Advantage Advanced Multi EA      3 Chewable Calcium (500 mg each, total 1200-1500 mg per day)  **Best Choice - Celebrate, Bariatric Advantage, or Wellesse  Other Options:  2 Celebrate MultiComplete with 18 mg Iron (this provides 6000 IU of  Vitamin D3)  4 Celebrate Essential Multi 2 in 1 (has calcium) + 18-60 mg separate  iron  Vitamins and Calcium are available at:   [facility]   [place], [place], [place] [address]   [email or web address].com  www.[email or web address]  www.amazon.com   h

## 2017-04-07 NOTE — Progress Notes (Signed)
Pre-Op Assessment Visit:  Pre-Operative Sleeve Gastrectomy Surgery  Medical Nutrition Therapy:  Appt start time: 2:45  End time:  3:45  Patient was seen on 04/07/2017 for Pre-Operative Nutrition Assessment. Assessment and letter of approval faxed to [facility] Bariatric Surgery Program coordinator on 04/07/2017.   Pt states she eats 3 meals a day but there was a lack of confidence in her answer so the dietitian inquired about the validity of this statement then pt replied please don't get me into trouble, Dietitian asked what she meant by that and pt states her psychologist (Dr. Ardath Sax) told her "Do you want to pass your psych eval? Then tell me you eat 3 times a day" Dietitian assured the pt nothing I type will keep her from having the surgery, I am only here to help you be the healthiest you and I cannot help you if you are not honest with me. Pt seemed calmed by this and was then seemingly honest with the dietitian.  Pt states she is paying for her surgery out of pocket.  Pt expectation of surgery: to be healthy and live longer   Pt expectation of Dietitian: to help me achieve success   Start weight at [facility]: 245.14.4 BMI: 48.02  24 hr Dietary Recall: First Meal: protein shake Snack:  Second Meal: tomato sandwich Snack: crackers Third Meal: pot pie with salad  Snack: popcorn  Beverages: peach tea, sweet tea, water  Encouraged to engage in 150 minutes of moderate physical activity including cardiovascular and weight baring weekly  Handouts given during visit include:  . Pre-Op Goals . Bariatric Surgery Protein Shakes During the appointment today the following Pre-Op Goals were reviewed with the patient: . Maintain or lose weight as instructed by your surgeon . Make healthy food choices . Begin to limit portion sizes . Limited concentrated sugars and fried foods . Keep fat/sugar in the single digits per serving on             food labels . Practice CHEWING your food   (aim for 30 chews per bite or until applesauce consistency) . Practice not drinking 15 minutes before, during, and 30 minutes after each meal/snack . Avoid all carbonated beverages  . Avoid/limit caffeinated beverages  . Avoid all sugar-sweetened beverages . Consume 3 meals per day; eat every 3-5 hours . Make a list of non-food related activities . Aim for 64-100 ounces of FLUID daily  . Aim for at least 60-80 grams of PROTEIN daily . Look for a liquid protein source that contain ?15 g protein and ?5 g carbohydrate  (ex: shakes, drinks, shots)  -Follow diet recommendations listed below   Energy and Macronutrient Recomendations: Calories: 1500 Carbohydrate: 170 Protein: 112 Fat: 42  Demonstrated degree of understanding via:  Teach Back  Teaching Method Utilized:  Visual Auditory Hands on  Barriers to learning/adherence to lifestyle change: none identified  Patient to call the [facility] to enroll in Pre-Op and Post-Op Nutrition Education when surgery date is scheduled.

## 2017-04-15 ENCOUNTER — Ambulatory Visit (HOSPITAL_COMMUNITY)
Admission: RE | Admit: 2017-04-15 | Discharge: 2017-04-15 | Disposition: A | Discharge status: Home / Self Care | Payer: BLUE CROSS/BLUE SHIELD | Source: Ambulatory Visit | Attending: Surgery | Admitting: Surgery

## 2017-04-15 ENCOUNTER — Encounter (HOSPITAL_COMMUNITY): Payer: Self-pay | Admitting: Radiology

## 2017-04-15 ENCOUNTER — Other Ambulatory Visit: Payer: Self-pay

## 2017-04-15 DIAGNOSIS — Z01810 Encounter for preprocedural cardiovascular examination: Secondary | ICD-10-CM | POA: Insufficient documentation

## 2017-04-15 DIAGNOSIS — R9431 Abnormal electrocardiogram [ECG] [EKG]: Secondary | ICD-10-CM | POA: Insufficient documentation

## 2017-05-03 ENCOUNTER — Encounter: Payer: BLUE CROSS/BLUE SHIELD | Attending: Surgery | Admitting: Registered"

## 2017-05-03 DIAGNOSIS — E669 Obesity, unspecified: Secondary | ICD-10-CM

## 2017-05-03 DIAGNOSIS — Z713 Dietary counseling and surveillance: Secondary | ICD-10-CM | POA: Insufficient documentation

## 2017-05-03 NOTE — Progress Notes (Signed)
  Pre-Operative Nutrition Class:  Appt start time: 8:15   End time:  9:15  Patient was seen on 05/03/2017 for Pre-Operative Bariatric Surgery Education at [facility].   Surgery date: TBD Surgery type: sleeve gastrectomy Start weight at [facility]: 245.4 Weight today: 245.5  TANITA  BODY COMP RESULTS  05/03/2017   BMI (kg/m^2) N/A   Fat Mass (lbs)    Fat Free Mass (lbs)    Total Body Water (lbs)    Samples given per MNT protocol. Patient educated on appropriate usage: Bariatric Advantage Multivitamin Lot # [id number] Exp: 04/2018  Bariatric Advantage Calcium Citrate-chocolate Lot # [id number] Exp: 10/09/2017  Bariatric Fusion Probiotic-orange tropical Lot # [id number] Exp: 09/03/2018  Renee Pain Protein Powder-chocolate Lot # [id number] Exp: 12/2017  Renee Pain Protein Powder-vanilla Lot # [id number] Exp: 12/2017  Premier Protein Drink Lot # [id number]  Exp: 09/28/2017  The following the learning objectives were met by the patient during this course:  Identify Pre-Op Dietary Goals and will begin 2 weeks pre-operatively  Identify appropriate sources of fluids and proteins   State protein recommendations and appropriate sources pre and post-operatively  Identify Post-Operative Dietary Goals and will follow for 2 weeks post-operatively  Identify appropriate multivitamin and calcium sources  Describe the need for physical activity post-operatively and will follow MD recommendations  State when to call healthcare provider regarding medication questions or post-operative complications  Handouts given during class include:  Pre-Op Bariatric Surgery Diet Handout  Protein Shake Handout  Post-Op Bariatric Surgery Nutrition Handout  [facility] Program Information Flyer  Support Group Information Flyer  [facility] Bariatric Supplements Price List  Follow-Up Plan: Patient will follow-up at [facility] 2 weeks post operatively for diet advancement per  MD.

## 2017-06-17 NOTE — Progress Notes (Signed)
Please place orders in EPIC as patient is being scheduled for a pre-op appointment! Thank you! 

## 2017-06-21 NOTE — Progress Notes (Signed)
Please place orders in EPIC as patient has a pre-op appointment on 06/24/2017! Thank you!

## 2017-06-22 NOTE — Progress Notes (Signed)
Need orders in epic for 8-28 surgery pre op is 8-23

## 2017-06-23 ENCOUNTER — Ambulatory Visit: Payer: Self-pay | Admitting: Surgery

## 2017-06-23 NOTE — Patient Instructions (Addendum)
LOUCILLE TAKACH  06/23/2017   Your procedure is scheduled on: 06-29-17  Report to [facility] Main  Entrance Take [place]  Elevators to 3rd floor to  [place] at 5:15 AM.   Call this number if you have problems the morning of surgery [phone]   Remember: ONLY 1 PERSON MAY GO WITH YOU TO SHORT STAY TO GET  READY MORNING OF [place].  Do not eat food or drink liquids :After Midnight.     Take these medicines the morning of surgery with A SIP OF WATER: Amlodipine (Norvasc), Alprazolam (Xanax)                                You may not have any metal on your body including hair pins and              piercings  Do not wear jewelry, make-up, lotions, powders or perfumes, deodorant             Do not wear nail polish.  Do not shave  48 hours prior to surgery.               Do not bring valuables to the hospital. [place].  Contacts, dentures or bridgework may not be worn into surgery.  Leave suitcase in the car. After surgery it may be brought to your room.                  Please read over the following fact sheets you were given: _____________________________________________________________________             [facility] - Preparing for Surgery Before surgery, you can play an important role.  Because skin is not sterile, your skin needs to be as free of germs as possible.  You can reduce the number of germs on your skin by washing with CHG (chlorahexidine gluconate) soap before surgery.  CHG is an antiseptic cleaner which kills germs and bonds with the skin to continue killing germs even after washing. Please DO NOT use if you have an allergy to CHG or antibacterial soaps.  If your skin becomes reddened/irritated stop using the CHG and inform your nurse when you arrive at Short Stay. Do not shave (including legs and underarms) for at least 48 hours prior to the first CHG shower.  You may shave your  face/neck. Please follow these instructions carefully:  1.  Shower with CHG Soap the night before surgery and the  morning of Surgery.  2.  If you choose to wash your hair, wash your hair first as usual with your  normal  shampoo.  3.  After you shampoo, rinse your hair and body thoroughly to remove the  shampoo.                           4.  Use CHG as you would any other liquid soap.  You can apply chg directly  to the skin and wash                       Gently with a scrungie or clean washcloth.  5.  Apply the CHG Soap to your body  ONLY FROM THE NECK DOWN.   Do not use on face/ open                           Wound or open sores. Avoid contact with eyes, ears mouth and genitals (private parts).                       Wash face,  Genitals (private parts) with your normal soap.             6.  Wash thoroughly, paying special attention to the area where your surgery  will be performed.  7.  Thoroughly rinse your body with warm water from the neck down.  8.  DO NOT shower/wash with your normal soap after using and rinsing off  the CHG Soap.                9.  Pat yourself dry with a clean towel.            10.  Wear clean pajamas.            11.  Place clean sheets on your bed the night of your first shower and do not  sleep with pets. Day of Surgery : Do not apply any lotions/deodorants the morning of surgery.  Please wear clean clothes to the hospital/surgery center.  FAILURE TO FOLLOW THESE INSTRUCTIONS MAY RESULT IN THE CANCELLATION OF YOUR SURGERY PATIENT SIGNATURE_________________________________  NURSE SIGNATURE__________________________________  ________________________________________________________________________

## 2017-06-23 NOTE — H&P (Signed)
Chief Complaint:  Morbid obesity and OSA for sleeve gastrectomy.   History of Present Illness:  [name] is an 51 y.o. female for sleeve gastrectomy.   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 Melinda Crutch with morbid obesity (BMI 48), hypertension, OSA, arthritis of knees, and borderline DM.  She has had prior lap chole and TAH.  She has tried numerous diets and the most successful were Phen Phen and Phentermine.  With both she lost much weight but then regained it when she went off the meds.  She actually regained more weight after cessation of the meds.  She has refractory weight loss and needs bariatric surgery.  She has been to our seminar and wants to have a sleeve gastrectomy.  I discussed both sleeve and roux Y with her and she is interested in the former.  Risks and benefits discussed.    No prior hx of DVT. I explained the procedure to her and her support person again.  We will plan to repair her umbilical hernia at the same time.  She has OSA but was unable to affore the $400 mask.  She is paying for this procedure out of pocket.  She is aware of the risks but very excited about the benefits.  Her surgery is next Tuesday.     Past Medical History:  Diagnosis Date  . Cancer ([place]) 1990   Cervical  . Carpal tunnel syndrome, bilateral   . Complication of anesthesia INTRAOP HYPERTENSION W/ TONSILLECTOMY SURGERY AGE 40--  NO ISSUES SINCE  . Left knee pain     Past Surgical History:  Procedure Laterality Date  . ANTERIOR CERVICAL DECOMP/DISCECTOMY FUSION  10-16-2010   C4  -  C6  . CERVICAL BIOPSY  W/ LOOP ELECTRODE EXCISION  2010  . CESAREAN SECTION  1987  . CHOLECYSTECTOMY  2009  . FOOT SURGERY  2010   LEFT  . KNEE ARTHROSCOPY  10/20/2012   Procedure: ARTHROSCOPY KNEE;  Surgeon: Mauri Pole, MD;  Location: [facility];  Service: Orthopedics;  Laterality: Left;  medial menisectomy medial patellar condroplasty and sinovectomy  . TONSILLECTOMY  AGE 40  .  TRANSTHORACIC ECHOCARDIOGRAM  06-20-2010   NORMAL LVSF/ EF 55%  . TUBAL LIGATION  1988    No current facility-administered medications for this encounter.    Current Outpatient Prescriptions  Medication Sig Dispense Refill  . acetaminophen (TYLENOL) 500 MG tablet Take 1,000 mg by mouth every 8 (eight) hours as needed for mild pain or headache.    . ALPRAZolam (XANAX) 0.25 MG tablet Take 0.25 mg by mouth 3 (three) times daily.  2  . amLODipine (NORVASC) 2.5 MG tablet TAKE 1 TABLET BY MOUTH ONCE A DAY  0  . Bioflavonoid Products (BIOFLEX) TABS Take 1 tablet by mouth 2 (two) times daily.    . flintstones complete (FLINTSTONES) 60 MG chewable tablet Chew 1 tablet by mouth daily.    . furosemide (LASIX) 20 MG tablet Take 20 mg by mouth daily as needed for fluid or edema.    . sertraline (ZOLOFT) 100 MG tablet Take 100 mg by mouth at bedtime.    . traZODone (DESYREL) 50 MG tablet Take 100 mg by mouth at bedtime.     Marland Kitchen HYDROcodone-acetaminophen (NORCO/VICODIN) 5-325 MG tablet Take 1 tablet by mouth every 4 (four) hours as needed for moderate pain. (Patient not taking: Reported on 06/22/2017) 10 tablet 0  . megestrol (MEGACE) 40 MG tablet Take 1 tablet (  40 mg total) by mouth 2 (two) times daily. 80mg  bid for 1 month then 59mb bid (Patient not taking: Reported on 06/22/2017) 120 tablet 6  . methocarbamol (ROBAXIN) 500 MG tablet Take 2 tablets (1,000 mg total) by mouth every 6 (six) hours as needed for muscle spasms. (Patient not taking: Reported on 06/22/2017) 30 tablet 0   Cortisone; Naproxen sodium; and Prednisone Family History  Problem Relation Age of Onset  . Adopted: Yes  . Cancer Mother   . Diabetes Mother   . Cancer Paternal Grandmother    Social History:   reports that she has never smoked. She has never used smokeless tobacco. She reports that she does not drink alcohol or use drugs.   REVIEW OF SYSTEMS : Negative except for see problem list  Physical Exam:   Last menstrual period  06/21/2015. There is no height or weight on file to calculate BMI.  Gen:  WDWN WF NAD  Neurological: Alert and oriented to person, place, and time. Motor and sensory function is grossly intact  Head: Normocephalic and atraumatic.  Eyes: Conjunctivae are normal. Pupils are equal, round, and reactive to light. No scleral icterus.  Neck: Normal range of motion. Neck supple. No tracheal deviation or thyromegaly present.  Cardiovascular:  SR without murmurs or gallops.  No carotid bruits Breast:  Not examined Respiratory: Effort normal.  No respiratory distress. No chest wall tenderness. Breath sounds normal.  No wheezes, rales or rhonchi.  Abdomen:  Lower midline scars from c section and hysterectomy.  Prominent bulging umbilical hernia GU:  Not examined Musculoskeletal: Normal range of motion. Extremities are nontender. No cyanosis, edema or clubbing noted Lymphadenopathy: No cervical, preauricular, postauricular or axillary adenopathy is present Skin: Skin is warm and dry. No rash noted. No diaphoresis. No erythema. No pallor. Pscyh: Normal mood and affect. Behavior is normal. Judgment and thought content normal.   LABORATORY RESULTS: No results found for this or any previous visit (from the past 48 hour(s)).   RADIOLOGY RESULTS: No results found.  Problem List: Patient Active Problem List   Diagnosis Date Noted  . S/P left knee arthroscopy 10/20/2012    Assessment & Plan: Morbid obesity for sleeve gastrectomy    Matt B. Hassell Done, MD, [facility] [phone] beeper [phone]  06/23/2017 9:14 AM

## 2017-06-24 ENCOUNTER — Encounter (HOSPITAL_COMMUNITY): Payer: Self-pay

## 2017-06-24 ENCOUNTER — Encounter (HOSPITAL_COMMUNITY)
Admission: RE | Admit: 2017-06-24 | Discharge: 2017-06-24 | Disposition: A | Discharge status: Home / Self Care | Payer: BLUE CROSS/BLUE SHIELD | Source: Ambulatory Visit | Attending: Surgery | Admitting: Surgery

## 2017-06-24 DIAGNOSIS — Z01812 Encounter for preprocedural laboratory examination: Secondary | ICD-10-CM | POA: Insufficient documentation

## 2017-06-24 DIAGNOSIS — Z6841 Body Mass Index (BMI) 40.0 and over, adult: Secondary | ICD-10-CM | POA: Insufficient documentation

## 2017-06-24 HISTORY — DX: Anxiety disorder, unspecified: F41.9

## 2017-06-24 HISTORY — DX: Sleep apnea, unspecified: G47.30

## 2017-06-24 LAB — CBC WITH DIFFERENTIAL/PLATELET
BASOS ABS: 0 10*3/uL (ref 0.0–0.1)
BASOS PCT: 0 %
Eosinophils Absolute: 0.2 10*3/uL (ref 0.0–0.7)
Eosinophils Relative: 3 %
HEMATOCRIT: 37.2 % (ref 36.0–46.0)
HEMOGLOBIN: 12.6 g/dL (ref 12.0–15.0)
LYMPHS PCT: 21 %
Lymphs Abs: 1.6 10*3/uL (ref 0.7–4.0)
MCH: 30.7 pg (ref 26.0–34.0)
MCHC: 33.9 g/dL (ref 30.0–36.0)
MCV: 90.5 fL (ref 78.0–100.0)
MONOS PCT: 8 %
Monocytes Absolute: 0.6 10*3/uL (ref 0.1–1.0)
NEUTROS ABS: 5.2 10*3/uL (ref 1.7–7.7)
NEUTROS PCT: 68 %
Platelets: 229 10*3/uL (ref 150–400)
RBC: 4.11 MIL/uL (ref 3.87–5.11)
RDW: 14.5 % (ref 11.5–15.5)
WBC: 7.6 10*3/uL (ref 4.0–10.5)

## 2017-06-24 LAB — COMPREHENSIVE METABOLIC PANEL
ALK PHOS: 58 U/L (ref 38–126)
ALT: 27 U/L (ref 14–54)
AST: 25 U/L (ref 15–41)
Albumin: 4 g/dL (ref 3.5–5.0)
Anion gap: 6 (ref 5–15)
BILIRUBIN TOTAL: 0.7 mg/dL (ref 0.3–1.2)
BUN: 14 mg/dL (ref 6–20)
CALCIUM: 9.4 mg/dL (ref 8.9–10.3)
CO2: 28 mmol/L (ref 22–32)
CREATININE: 0.59 mg/dL (ref 0.44–1.00)
Chloride: 106 mmol/L (ref 101–111)
GFR calc Af Amer: >60 mL/min (ref >60)
GLUCOSE: 94 mg/dL (ref 65–99)
Potassium: 4.1 mmol/L (ref 3.5–5.1)
Sodium: 140 mmol/L (ref 135–145)
TOTAL PROTEIN: 7.2 g/dL (ref 6.5–8.1)

## 2017-06-24 MED FILL — oxyCODONE HCL 5 MG/5ML SOLN: 5 | 2 days supply | Qty: 120 | Fill #0

## 2017-06-24 NOTE — Progress Notes (Signed)
04-15-17 (EPIC) EKG, CXR

## 2017-06-29 ENCOUNTER — Ambulatory Visit (HOSPITAL_COMMUNITY): Payer: BLUE CROSS/BLUE SHIELD | Admitting: Anesthesiology

## 2017-06-29 ENCOUNTER — Encounter (HOSPITAL_COMMUNITY): Payer: Self-pay | Admitting: *Deleted

## 2017-06-29 ENCOUNTER — Observation Stay (HOSPITAL_COMMUNITY)
Admission: RE | Admit: 2017-06-29 | Discharge: 2017-06-30 | Disposition: A | Discharge status: Home / Self Care | Payer: BLUE CROSS/BLUE SHIELD | Source: Ambulatory Visit | Attending: Surgery | Admitting: Surgery

## 2017-06-29 ENCOUNTER — Encounter (HOSPITAL_COMMUNITY)
Admission: RE | Disposition: A | Discharge status: Home / Self Care | Payer: Self-pay | Source: Ambulatory Visit | Attending: Surgery

## 2017-06-29 DIAGNOSIS — I10 Essential (primary) hypertension: Secondary | ICD-10-CM | POA: Insufficient documentation

## 2017-06-29 DIAGNOSIS — Z79899 Other long term (current) drug therapy: Secondary | ICD-10-CM | POA: Insufficient documentation

## 2017-06-29 DIAGNOSIS — Z6841 Body Mass Index (BMI) 40.0 and over, adult: Secondary | ICD-10-CM | POA: Insufficient documentation

## 2017-06-29 DIAGNOSIS — Z9884 Bariatric surgery status: Secondary | ICD-10-CM

## 2017-06-29 DIAGNOSIS — R7303 Prediabetes: Secondary | ICD-10-CM | POA: Insufficient documentation

## 2017-06-29 DIAGNOSIS — Z888 Allergy status to other drugs, medicaments and biological substances status: Secondary | ICD-10-CM | POA: Diagnosis not present

## 2017-06-29 DIAGNOSIS — Z981 Arthrodesis status: Secondary | ICD-10-CM | POA: Insufficient documentation

## 2017-06-29 DIAGNOSIS — K429 Umbilical hernia without obstruction or gangrene: Secondary | ICD-10-CM | POA: Diagnosis not present

## 2017-06-29 DIAGNOSIS — F419 Anxiety disorder, unspecified: Secondary | ICD-10-CM | POA: Diagnosis not present

## 2017-06-29 DIAGNOSIS — Z886 Allergy status to analgesic agent status: Secondary | ICD-10-CM | POA: Insufficient documentation

## 2017-06-29 DIAGNOSIS — F329 Major depressive disorder, single episode, unspecified: Secondary | ICD-10-CM | POA: Insufficient documentation

## 2017-06-29 DIAGNOSIS — G4733 Obstructive sleep apnea (adult) (pediatric): Secondary | ICD-10-CM | POA: Diagnosis not present

## 2017-06-29 DIAGNOSIS — M170 Bilateral primary osteoarthritis of knee: Secondary | ICD-10-CM | POA: Insufficient documentation

## 2017-06-29 HISTORY — PX: LAPAROSCOPIC GASTRIC SLEEVE RESECTION: SHX5895

## 2017-06-29 HISTORY — PX: UMBILICAL HERNIA REPAIR: SHX196

## 2017-06-29 LAB — CBC
HEMATOCRIT: 37.3 % (ref 36.0–46.0)
HEMOGLOBIN: 12.6 g/dL (ref 12.0–15.0)
MCH: 30.7 pg (ref 26.0–34.0)
MCHC: 33.8 g/dL (ref 30.0–36.0)
MCV: 90.8 fL (ref 78.0–100.0)
Platelets: 211 10*3/uL (ref 150–400)
RBC: 4.11 MIL/uL (ref 3.87–5.11)
RDW: 14.7 % (ref 11.5–15.5)
WBC: 8.7 10*3/uL (ref 4.0–10.5)

## 2017-06-29 LAB — CREATININE, SERUM: Creatinine, Ser: 0.64 mg/dL (ref 0.44–1.00)

## 2017-06-29 SURGERY — GASTRECTOMY, SLEEVE, LAPAROSCOPIC
Anesthesia: General | Site: Abdomen

## 2017-06-29 MED ORDER — ONDANSETRON HCL 4 MG/2ML IJ SOLN
4.0000 mg | INTRAMUSCULAR | Status: DC | PRN
  Administered 2017-06-29 – 2017-06-30 (×3): 4 mg via INTRAVENOUS
  Filled 2017-06-29 (×2): qty 2

## 2017-06-29 MED ORDER — CEFOTETAN DISODIUM-DEXTROSE 2-2.08 GM-% IV SOLR
2.0000 g | INTRAVENOUS | Status: AC
  Administered 2017-06-29: 2 g via INTRAVENOUS

## 2017-06-29 MED ORDER — CHLORHEXIDINE GLUCONATE CLOTH 2 % EX PADS: 6.0000 | MEDICATED_PAD | Freq: Once | CUTANEOUS | Status: DC

## 2017-06-29 MED ORDER — BUPIVACAINE LIPOSOME 1.3 % IJ SUSP
20.0000 mL | Freq: Once | INTRAMUSCULAR | Status: DC
  Filled 2017-06-29: qty 20

## 2017-06-29 MED ORDER — HEPARIN SODIUM (PORCINE) 5000 UNIT/ML IJ SOLN
5000.0000 [IU] | INTRAMUSCULAR | Status: AC
  Administered 2017-06-29: 5000 [IU] via SUBCUTANEOUS
  Filled 2017-06-29: qty 1

## 2017-06-29 MED ORDER — PANTOPRAZOLE SODIUM 40 MG IV SOLR
40.0000 mg | Freq: Every day | INTRAVENOUS | Status: DC
  Administered 2017-06-29: 40 mg via INTRAVENOUS
  Filled 2017-06-29: qty 40

## 2017-06-29 MED ORDER — CEFOTETAN DISODIUM-DEXTROSE 2-2.08 GM-% IV SOLR
INTRAVENOUS | Status: AC
  Filled 2017-06-29: qty 50

## 2017-06-29 MED ORDER — MIDAZOLAM HCL 5 MG/5ML IJ SOLN
INTRAMUSCULAR | Status: DC | PRN
  Administered 2017-06-29: 2 mg via INTRAVENOUS

## 2017-06-29 MED ORDER — ACETAMINOPHEN 160 MG/5ML PO SOLN: 325.0000 mg | ORAL | Status: DC | PRN

## 2017-06-29 MED ORDER — LIDOCAINE HCL (CARDIAC) 20 MG/ML IV SOLN
INTRAVENOUS | Status: DC | PRN
  Administered 2017-06-29: 100 mg via INTRAVENOUS

## 2017-06-29 MED ORDER — PHENYLEPHRINE 40 MCG/ML (10ML) SYRINGE FOR IV PUSH (FOR BLOOD PRESSURE SUPPORT)
PREFILLED_SYRINGE | INTRAVENOUS | Status: AC
  Filled 2017-06-29: qty 20

## 2017-06-29 MED ORDER — LIP MEDEX EX OINT
TOPICAL_OINTMENT | CUTANEOUS | Status: AC
  Filled 2017-06-29: qty 7

## 2017-06-29 MED ORDER — LIDOCAINE 2% (20 MG/ML) 5 ML SYRINGE
INTRAMUSCULAR | Status: AC
  Filled 2017-06-29: qty 5

## 2017-06-29 MED ORDER — BUPIVACAINE-EPINEPHRINE 0.25% -1:200000 IJ SOLN
INTRAMUSCULAR | Status: DC | PRN
  Administered 2017-06-29: 30 mL

## 2017-06-29 MED ORDER — LACTATED RINGERS IV SOLN
INTRAVENOUS | Status: DC | PRN
  Administered 2017-06-29 (×2): via INTRAVENOUS

## 2017-06-29 MED ORDER — ONDANSETRON HCL 4 MG/2ML IJ SOLN
INTRAMUSCULAR | Status: AC
  Filled 2017-06-29: qty 2

## 2017-06-29 MED ORDER — 0.9 % SODIUM CHLORIDE (POUR BTL) OPTIME
TOPICAL | Status: DC | PRN
  Administered 2017-06-29: 1000 mL

## 2017-06-29 MED ORDER — PREMIER PROTEIN SHAKE
2.0000 [oz_av] | ORAL | Status: DC
  Administered 2017-06-30: 2 [oz_av] via ORAL

## 2017-06-29 MED ORDER — ROCURONIUM BROMIDE 50 MG/5ML IV SOSY
PREFILLED_SYRINGE | INTRAVENOUS | Status: AC
  Filled 2017-06-29: qty 5

## 2017-06-29 MED ORDER — SUGAMMADEX SODIUM 500 MG/5ML IV SOLN
INTRAVENOUS | Status: AC
  Filled 2017-06-29: qty 5

## 2017-06-29 MED ORDER — ACETAMINOPHEN 325 MG PO TABS: 650.0000 mg | ORAL_TABLET | ORAL | Status: DC | PRN

## 2017-06-29 MED ORDER — FENTANYL CITRATE (PF) 100 MCG/2ML IJ SOLN
INTRAMUSCULAR | Status: DC | PRN
  Administered 2017-06-29 (×3): 50 ug via INTRAVENOUS

## 2017-06-29 MED ORDER — EPHEDRINE 5 MG/ML INJ
INTRAVENOUS | Status: AC
  Filled 2017-06-29: qty 10

## 2017-06-29 MED ORDER — PROPOFOL 10 MG/ML IV BOLUS
INTRAVENOUS | Status: DC | PRN
  Administered 2017-06-29: 200 mg via INTRAVENOUS

## 2017-06-29 MED ORDER — SUGAMMADEX SODIUM 200 MG/2ML IV SOLN
INTRAVENOUS | Status: DC | PRN
  Administered 2017-06-29: 219.6 mg via INTRAVENOUS

## 2017-06-29 MED ORDER — HYDROMORPHONE HCL-NACL 0.5-0.9 MG/ML-% IV SOSY
PREFILLED_SYRINGE | INTRAVENOUS | Status: AC
  Filled 2017-06-29: qty 2

## 2017-06-29 MED ORDER — PHENYLEPHRINE HCL 10 MG/ML IJ SOLN
INTRAMUSCULAR | Status: DC | PRN
  Administered 2017-06-29: 120 ug via INTRAVENOUS
  Administered 2017-06-29 (×2): 80 ug via INTRAVENOUS
  Administered 2017-06-29 (×2): 120 ug via INTRAVENOUS

## 2017-06-29 MED ORDER — DEXAMETHASONE SODIUM PHOSPHATE 10 MG/ML IJ SOLN
INTRAMUSCULAR | Status: AC
  Filled 2017-06-29: qty 1

## 2017-06-29 MED ORDER — ACETAMINOPHEN 10 MG/ML IV SOLN: 1000.0000 mg | Freq: Once | INTRAVENOUS | Status: DC | PRN

## 2017-06-29 MED ORDER — ROCURONIUM BROMIDE 100 MG/10ML IV SOLN
INTRAVENOUS | Status: DC | PRN
  Administered 2017-06-29: 50 mg via INTRAVENOUS
  Administered 2017-06-29: 10 mg via INTRAVENOUS

## 2017-06-29 MED ORDER — PROPOFOL 10 MG/ML IV BOLUS
INTRAVENOUS | Status: AC
  Filled 2017-06-29: qty 40

## 2017-06-29 MED ORDER — HYDROMORPHONE HCL-NACL 0.5-0.9 MG/ML-% IV SOSY
0.2500 mg | PREFILLED_SYRINGE | INTRAVENOUS | Status: DC | PRN
  Administered 2017-06-29: 0.5 mg via INTRAVENOUS

## 2017-06-29 MED ORDER — HEPARIN SODIUM (PORCINE) 5000 UNIT/ML IJ SOLN
5000.0000 [IU] | Freq: Three times a day (TID) | INTRAMUSCULAR | Status: DC
  Administered 2017-06-29 – 2017-06-30 (×3): 5000 [IU] via SUBCUTANEOUS
  Filled 2017-06-29 (×3): qty 1

## 2017-06-29 MED ORDER — KCL IN DEXTROSE-NACL 20-5-0.45 MEQ/L-%-% IV SOLN
INTRAVENOUS | Status: DC
  Administered 2017-06-29: 16:00:00 via INTRAVENOUS
  Administered 2017-06-30: 1000 mL via INTRAVENOUS
  Administered 2017-06-30: 11:00:00 via INTRAVENOUS
  Filled 2017-06-29 (×3): qty 1000

## 2017-06-29 MED ORDER — FENTANYL CITRATE (PF) 250 MCG/5ML IJ SOLN
INTRAMUSCULAR | Status: AC
  Filled 2017-06-29: qty 5

## 2017-06-29 MED ORDER — GLYCOPYRROLATE 0.2 MG/ML IJ SOLN
INTRAMUSCULAR | Status: DC | PRN
  Administered 2017-06-29: 0.2 mg via INTRAVENOUS

## 2017-06-29 MED ORDER — OXYCODONE HCL 5 MG/5ML PO SOLN
5.0000 mg | ORAL | Status: DC | PRN
  Administered 2017-06-30 (×2): 10 mg via ORAL
  Administered 2017-06-30: 5 mg via ORAL
  Filled 2017-06-29: qty 5
  Filled 2017-06-29 (×2): qty 10

## 2017-06-29 MED ORDER — SCOPOLAMINE 1 MG/3DAYS TD PT72
MEDICATED_PATCH | TRANSDERMAL | Status: AC
  Filled 2017-06-29: qty 1

## 2017-06-29 MED ORDER — EPHEDRINE SULFATE 50 MG/ML IJ SOLN
INTRAMUSCULAR | Status: DC | PRN
  Administered 2017-06-29 (×3): 15 mg via INTRAVENOUS
  Administered 2017-06-29: 10 mg via INTRAVENOUS

## 2017-06-29 MED ORDER — MORPHINE SULFATE (PF) 2 MG/ML IV SOLN
1.0000 mg | INTRAVENOUS | Status: DC | PRN
  Administered 2017-06-29: 2 mg via INTRAVENOUS
  Filled 2017-06-29: qty 1

## 2017-06-29 MED ORDER — PROMETHAZINE HCL 25 MG/ML IJ SOLN
6.2500 mg | INTRAMUSCULAR | Status: DC | PRN
Start: 2017-06-29 — End: 2017-06-29

## 2017-06-29 MED ORDER — BUPIVACAINE-EPINEPHRINE (PF) 0.25% -1:200000 IJ SOLN
INTRAMUSCULAR | Status: AC
  Filled 2017-06-29: qty 30

## 2017-06-29 MED ORDER — MIDAZOLAM HCL 2 MG/2ML IJ SOLN
INTRAMUSCULAR | Status: AC
  Filled 2017-06-29: qty 2

## 2017-06-29 MED ORDER — BUPIVACAINE LIPOSOME 1.3 % IJ SUSP: 40.0000 mL | Freq: Once | INTRAMUSCULAR | Status: DC

## 2017-06-29 MED ORDER — ONDANSETRON HCL 4 MG/2ML IJ SOLN
INTRAMUSCULAR | Status: DC | PRN
Start: 2017-06-29 — End: 2017-06-29
  Administered 2017-06-29: 4 mg via INTRAVENOUS

## 2017-06-29 MED ORDER — MEPERIDINE HCL 50 MG/ML IJ SOLN: 6.2500 mg | INTRAMUSCULAR | Status: DC | PRN

## 2017-06-29 MED ORDER — GLYCOPYRROLATE 0.2 MG/ML IV SOSY
PREFILLED_SYRINGE | INTRAVENOUS | Status: AC
  Filled 2017-06-29: qty 5

## 2017-06-29 MED ORDER — APREPITANT 40 MG PO CAPS
40.0000 mg | ORAL_CAPSULE | ORAL | Status: AC
  Administered 2017-06-29: 40 mg via ORAL
  Filled 2017-06-29: qty 1

## 2017-06-29 MED ORDER — SCOPOLAMINE 1 MG/3DAYS TD PT72
MEDICATED_PATCH | TRANSDERMAL | Status: DC | PRN
  Administered 2017-06-29: 1 via TRANSDERMAL

## 2017-06-29 SURGICAL SUPPLY — 75 items
APPLICATOR COTTON TIP 6IN STRL (MISCELLANEOUS) IMPLANT
APPLIER CLIP 5 13 M/L LIGAMAX5 (MISCELLANEOUS)
APPLIER CLIP ROT 10 11.4 M/L (STAPLE)
APPLIER CLIP ROT 13.4 12 LRG (CLIP)
BINDER ABDOMINAL 12 ML 46-62 (SOFTGOODS) ×4 IMPLANT
BLADE SURG 15 STRL LF DISP TIS (BLADE) ×3 IMPLANT
BLADE SURG 15 STRL SS (BLADE) ×1
CABLE HIGH FREQUENCY MONO STRZ (ELECTRODE) ×4 IMPLANT
CLIP APPLIE 5 13 M/L LIGAMAX5 (MISCELLANEOUS) IMPLANT
CLIP APPLIE ROT 10 11.4 M/L (STAPLE) IMPLANT
CLIP APPLIE ROT 13.4 12 LRG (CLIP) IMPLANT
COVER SURGICAL LIGHT HANDLE (MISCELLANEOUS) ×4 IMPLANT
DECANTER SPIKE VIAL GLASS SM (MISCELLANEOUS) IMPLANT
DERMABOND ADVANCED (GAUZE/BANDAGES/DRESSINGS) ×1
DERMABOND ADVANCED .7 DNX12 (GAUZE/BANDAGES/DRESSINGS) ×3 IMPLANT
DEVICE PMI PUNCTURE CLOSURE (MISCELLANEOUS) ×4 IMPLANT
DEVICE SECURE STRAP 25 ABSORB (INSTRUMENTS) IMPLANT
DEVICE SUT QUICK LOAD TK 5 (STAPLE) IMPLANT
DEVICE SUT TI-KNOT TK 5X26 (MISCELLANEOUS) IMPLANT
DEVICE SUTURE ENDOST 10MM (ENDOMECHANICALS) IMPLANT
DEVICE TROCAR PUNCTURE CLOSURE (ENDOMECHANICALS) IMPLANT
DISSECTOR BLUNT TIP ENDO 5MM (MISCELLANEOUS) IMPLANT
DRAIN CHANNEL 19F RND (DRAIN) IMPLANT
ELECT PENCIL ROCKER SW 15FT (MISCELLANEOUS) IMPLANT
ELECT REM PT RETURN 15FT ADLT (MISCELLANEOUS) ×4 IMPLANT
EVACUATOR SILICONE 100CC (DRAIN) IMPLANT
GAUZE SPONGE 4X4 12PLY STRL (GAUZE/BANDAGES/DRESSINGS) IMPLANT
GLOVE BIOGEL M 8.0 STRL (GLOVE) ×8 IMPLANT
GOWN SPEC L4 XLG W/TWL (GOWN DISPOSABLE) IMPLANT
GOWN STRL REUS W/TWL XL LVL3 (GOWN DISPOSABLE) ×16 IMPLANT
HANDLE STAPLE EGIA 4 XL (STAPLE) ×4 IMPLANT
HOVERMATT SINGLE USE (MISCELLANEOUS) ×4 IMPLANT
IRRIG SUCT STRYKERFLOW 2 WTIP (MISCELLANEOUS)
IRRIGATION SUCT STRKRFLW 2 WTP (MISCELLANEOUS) IMPLANT
KIT BASIN OR (CUSTOM PROCEDURE TRAY) ×4 IMPLANT
MARKER SKIN DUAL TIP RULER LAB (MISCELLANEOUS) ×4 IMPLANT
NEEDLE SPNL 22GX3.5 QUINCKE BK (NEEDLE) ×4 IMPLANT
PACK UNIVERSAL I (CUSTOM PROCEDURE TRAY) ×4 IMPLANT
PAD POSITIONING PINK XL (MISCELLANEOUS) IMPLANT
POSITIONER SURGICAL ARM (MISCELLANEOUS) IMPLANT
RELOAD TRI 45 ART MED THCK BLK (STAPLE) ×4 IMPLANT
RELOAD TRI 45 ART MED THCK PUR (STAPLE) ×8 IMPLANT
RELOAD TRI 60 ART MED THCK BLK (STAPLE) ×8 IMPLANT
RELOAD TRI 60 ART MED THCK PUR (STAPLE) ×4 IMPLANT
SCISSORS LAP 5X45 EPIX DISP (ENDOMECHANICALS) IMPLANT
SET IRRIG TUBING LAPAROSCOPIC (IRRIGATION / IRRIGATOR) IMPLANT
SHEARS HARMONIC ACE PLUS 45CM (MISCELLANEOUS) ×4 IMPLANT
SLEEVE ADV FIXATION 5X100MM (TROCAR) ×8 IMPLANT
SLEEVE GASTRECTOMY 36FR VISIGI (MISCELLANEOUS) ×4 IMPLANT
SLEEVE XCEL OPT CAN 5 100 (ENDOMECHANICALS) ×12 IMPLANT
SOLUTION ANTI FOG 6CC (MISCELLANEOUS) ×4 IMPLANT
SPONGE LAP 18X18 X RAY DECT (DISPOSABLE) ×4 IMPLANT
STAPLER VISISTAT 35W (STAPLE) ×4 IMPLANT
SUT MNCRL AB 4-0 PS2 18 (SUTURE) ×12 IMPLANT
SUT NOVA 0 T19/GS 22DT (SUTURE) IMPLANT
SUT NOVA NAB DX-16 0-1 5-0 T12 (SUTURE) ×4 IMPLANT
SUT SURGIDAC NAB ES-9 0 48 120 (SUTURE) IMPLANT
SUT VIC AB 4-0 SH 18 (SUTURE) ×4 IMPLANT
SUT VICRYL 0 TIES 12 18 (SUTURE) ×4 IMPLANT
SYR 10ML ECCENTRIC (SYRINGE) ×4 IMPLANT
SYR 20CC LL (SYRINGE) ×4 IMPLANT
SYR 50ML LL SCALE MARK (SYRINGE) IMPLANT
TACKER 5MM HERNIA 3.5CML NAB (ENDOMECHANICALS) IMPLANT
TOWEL OR 17X26 10 PK STRL BLUE (TOWEL DISPOSABLE) ×8 IMPLANT
TOWEL OR NON WOVEN STRL DISP B (DISPOSABLE) ×4 IMPLANT
TRAY FOLEY W/METER SILVER 16FR (SET/KITS/TRAYS/PACK) IMPLANT
TRAY LAPAROSCOPIC (CUSTOM PROCEDURE TRAY) ×4 IMPLANT
TROCAR ADV FIXATION 5X100MM (TROCAR) ×4 IMPLANT
TROCAR BLADELESS 15MM (ENDOMECHANICALS) ×4 IMPLANT
TROCAR BLADELESS OPT 5 100 (ENDOMECHANICALS) IMPLANT
TROCAR XCEL NON-BLD 11X100MML (ENDOMECHANICALS) ×4 IMPLANT
TUBE CALIBRATION LAPBAND (TUBING) IMPLANT
TUBING CONNECTING 10 (TUBING) IMPLANT
TUBING ENDO SMARTCAP (MISCELLANEOUS) IMPLANT
TUBING INSUF HEATED (TUBING) ×4 IMPLANT

## 2017-06-29 NOTE — Discharge Instructions (Signed)

## 2017-06-29 NOTE — Anesthesia Procedure Notes (Signed)
Procedure Name: Intubation Date/Time: 06/29/2017 7:40 AM Performed by: Glory Buff Pre-anesthesia Checklist: Emergency Drugs available, Patient identified, Suction available and Patient being monitored Patient Re-evaluated:Patient Re-evaluated prior to induction Preoxygenation: Pre-oxygenation with 100% oxygen Induction Type: IV induction Ventilation: Mask ventilation without difficulty Laryngoscope Size: Glidescope and 3 Tube size: 7.0 mm Number of attempts: 2 Airway Equipment and Method: Rigid stylet Placement Confirmation: ETT inserted through vocal cords under direct vision,  positive ETCO2 and breath sounds checked- equal and bilateral Secured at: 21 cm Tube secured with: Tape Dental Injury: Teeth and Oropharynx as per pre-operative assessment

## 2017-06-29 NOTE — Transfer of Care (Signed)
Immediate Anesthesia Transfer of Care Note  Patient: [name]  Procedure(s) Performed: Procedure(s): LAPAROSCOPIC GASTRIC SLEEVE RESECTION (N/A) HERNIA REPAIR UMBILICAL ADULT  Patient Location: PACU  Anesthesia Type:General  Level of Consciousness: awake, alert  and oriented  Airway & Oxygen Therapy: Patient Spontanous Breathing and Patient connected to face mask oxygen  Post-op Assessment: Report given to RN and Post -op Vital signs reviewed and stable  Post vital signs: Reviewed and stable  Last Vitals:  Vitals:   06/29/17 0519  BP: 128/69  Pulse: 84  Resp: 18  Temp: 36.5 C  SpO2: 97%    Last Pain:  Vitals:   06/29/17 0519  TempSrc: Oral      Patients Stated Pain Goal: 3 (51/89/84 2103)  Complications: No apparent anesthesia complications

## 2017-06-29 NOTE — Interval H&P Note (Signed)
History and Physical Interval Note:  06/29/2017 7:08 AM  [name]  has presented today for surgery, with the diagnosis of MORBID OBESITY; UMBILICAL HERNIA W/O GANGREEN AND W OR W/O  OBSTRUCTION  The various methods of treatment have been discussed with the patient and family. After consideration of risks, benefits and other options for treatment, the patient has consented to  Procedure(s): LAPAROSCOPIC GASTRIC SLEEVE RESECTION WITH UPPER ENDO (N/A) LAPAROSCOPIC UMBILICAL HERNIA (N/A) as a surgical intervention .  [name] history has been reviewed, patient examined, no change in status, stable for surgery.  I have reviewed the patient's chart and labs.  Questions were answered to the patient's satisfaction.     Rosalita Carey B

## 2017-06-29 NOTE — Op Note (Signed)
Surgeon: Kaylyn Lim, MD, FACS  Asst:   Annye English, MD  Anes:  General endotracheal  Procedure: Laparoscopic sleeve gastrectomy and open repair of umbilical hernia  Diagnosis: Morbid obesity  Complications: None noted  EBL:   8 cc  Description of Procedure:  The patient was take to OR 2 and given general anesthesia.  The abdomen was prepped with Technicare and draped sterilely.  A timeout was performed.  Access to the abdomen was achieved with a 5 mm Optiview.  All Ethicon trocars were used.  Following insufflation, the state of the abdomen was found to have omentum stuck adjacent a prominent umbilical hernia.  The ViSiGi 36Fr tube was inserted to deflate the stomach and was pulled back into the esophagus.    The pylorus was identified and we measured ~5 cm back and marked the antrum.  At that point we began dissection to take down the greater curvature of the stomach using the Harmonic scalpel.  This dissection was taken all the way up to the left crus.  Posterior attachments of the stomach were also taken down.    The ViSiGi tube was then passed into the antrum and suction applied so that it was snug along the lessor curvature.  The "crow's foot" or incisura was identified.  The sleeve gastrectomy was begun using the [employer] stapler beginning with a 4.5 mm black load followed by a 6 black load and then purple loads all with TRS.  When the sleeve was complete the tube was taken off suction and insufflated briefly.  Good symmetry was noted.  No bubbles or bleeding were noted.    The specimen was extracted through the 15 trocar site.  This was repaired with a single 0 vicryl.    Wounds were infiltrated with marcaine and closed with 4-0 monocryl.    The umbilical hernia was approached with an infraumbilical incision and the sack was dissected free.  We observed our dissection with the laparoscope and eventually resected the sac and close the defect transversely with 1 Novafil and  4-0 Monocryl.  Dermabond was used on the skin and then an abdominal binder was applied.  Catalina Antigua B. Hassell Done, [place], [facility], [place]

## 2017-06-29 NOTE — Anesthesia Preprocedure Evaluation (Addendum)
Anesthesia Evaluation  Patient identified by MRN, date of birth, ID band Patient awake    Reviewed: Allergy & Precautions, NPO status , Patient's Chart, lab work & pertinent test results  Airway Mallampati: I       Dental no notable dental hx. (+) Teeth Intact   Pulmonary sleep apnea ,    Pulmonary exam normal breath sounds clear to auscultation       Cardiovascular hypertension, Pt. on medications Normal cardiovascular exam Rhythm:Regular Rate:Normal     Neuro/Psych PSYCHIATRIC DISORDERS Anxiety Depression    GI/Hepatic negative GI ROS, Neg liver ROS,   Endo/Other  Morbid obesity  Renal/GU negative Renal ROS     Musculoskeletal negative musculoskeletal ROS (+)   Abdominal (+) + obese,   Peds  Hematology negative hematology ROS (+)   Anesthesia Other Findings [name] Echo  Order# [id number]  Ordering physician: Rise Patience, MD Study date: 06/20/2010 Study Result   Result status: Final result *[facility]*             [address], [place] [address]               [phone]  -------------------------------------------------------------------- Transthoracic Echocardiography  Patient:  Kerri, Lucero MR #:    [id number] Study Date: 06/20/2010 Gender:   F Age:    51 Height:   152.4cm Weight:   101.4kg BSA:    2.15m^2 Pt. Status: Room:    [name], [name] ATTENDING  [name], [name] cc:  -------------------------------------------------------------------- Indications:  Chest pain 786.51.  -------------------------------------------------------------------- History: PMH: Near syncopal episode.  Obesity.  -------------------------------------------------------------------- Study Conclusions  - Left ventricle: The cavity size was normal. Wall thickness was  normal. Systolic function was normal. The estimated ejection  fraction was 55%. Although no diagnostic regional wall motion  abnormality was identified, this possibility cannot be completely  excluded on the basis of this study. Probably normal diastolic  function. - Aortic valve: There was no stenosis. - Mitral valve: No significant regurgitation. - Left atrium: The atrium was mildly dilated. - Right ventricle: The cavity size was normal. Systolic function was  normal. - Right atrium: The atrium was mildly dilated. - Pulmonary arteries: No TR doppler jet so unable to estimate PA  systolic pressure. - Inferior vena cava: The vessel was normal in size; the  respirophasic diameter changes were in the normal range (= 50%);  findings are consistent with normal central venous pressure. - Pericardium, extracardiac: A trivial pericardial effusion was  identified posterior to the heart. Transthoracic echocardiography. M-mode, complete 2D, spectral Doppler, and color Doppler. Height: Height: 152.4cm. Height: 60in. Weight: Weight: 101.4kg. Weight: 223lb. Body mass index: BMI: 43.6kg/m^2. Body surface area: BSA: 2.56m^2. Blood pressure: 132/68. Patient status: Inpatient. Location: Echo laboratory.  --------------------------------------------------------------------  -------------------------------------------------------------------- Left ventricle: The cavity size was normal. Wall thickness was normal. Systolic function was normal. The estimated ejection fraction was 55%. Although no diagnostic regional wall motion abnormality was identified, this possibility cannot be completely excluded on the basis of this study. Probably normal  diastolic function.  -------------------------------------------------------------------- Aortic valve: Trileaflet. Doppler: There was no stenosis. No regurgitation.  -------------------------------------------------------------------- Aorta: Aortic root: The aortic root was normal in size. Ascending aorta: The ascending aorta was normal in size.  -------------------------------------------------------------------- Mitral  valve: Doppler: There was no evidence for stenosis. No significant regurgitation. Peak gradient: 14mm Hg (D).  -------------------------------------------------------------------- Left atrium: The atrium was mildly dilated.  -------------------------------------------------------------------- Right ventricle: The cavity size was normal. Systolic function was normal.  -------------------------------------------------------------------- Pulmonic valve: Structurally normal valve. Cusp separation was normal. Doppler: Transvalvular velocity was within the normal range. No regurgitation.  -------------------------------------------------------------------- Tricuspid valve: Doppler: No significant regurgitation.  -------------------------------------------------------------------- Pulmonary artery: No TR doppler jet so unable to estimate PA systolic pressure.  -------------------------------------------------------------------- Right atrium: The atrium was mildly dilated.  -------------------------------------------------------------------- Pericardium: A trivial pericardial effusion was identified posterior to the heart.  -------------------------------------------------------------------- Systemic veins: Inferior vena cava: The vessel was normal in size; the respirophasic diameter changes were in the normal range (= 50%); findings are consistent with normal central venous  pressure.  --------------------------------------------------------------------  2D measurements      Normal Doppler measurements   Normal Left ventricle           Left ventricle LVID ED,     45 mm   43-52  Ea, lat ann,  7.02 cm/s ------- chord, PLAX            tiss DP LVID ES,     30 mm   23-38  E/Ea, lat   12.24    ------- chord, PLAX            ann, tiss DP FS, chord,    33 %   >29   Ea, med ann,  7.12 cm/s ------- PLAX                tiss DP LVPW, ED     9 mm   ------ E/Ea, med   12.06    ------- IVS/LVPW    0.89    <1.3  ann, tiss DP ratio, ED             Mitral valve Ventricular septum         Peak E vel   85.9 cm/s ------- IVS, ED      8 mm   ------ Peak A vel   55.3 cm/s ------- Aorta               Deceleration  317 ms  150-230 Root diam, ED  29 mm   ------ time Left atrium            Peak       3 mm Hg ------- AP dim      30 mm   ------ gradient, D AP dim index  1.41 cm/m^2 <2.2  Peak E/A    1.6    -------                  ratio  -------------------------------------------------------------------- Prepared and Electronically Authenticated by  Loralie Champagne, MD 2011-08-19T18:[id number] Patient Information   Patient Name Kerri, Lucero Sex Female DOB [birth year] SSN [id number] Reason For Exam  Priority: Routine  Not on file Surgical History   Surgical History    No past medical history on file.  Other Surgical History    Procedure Laterality Date Comment Source ABDOMINAL HYSTERECTOMY   May 15, 2016 Provider ANTERIOR CERVICAL DECOMP/DISCECTOMY FUSION  10-16-2010 C4 - C6 Provider CERVICAL BIOPSY W/ LOOP ELECTRODE EXCISION  2010  Provider [place]  Provider CHOLECYSTECTOMY  2009  Provider FOOT  SURGERY  2010 LEFT Provider KNEE ARTHROSCOPY  10/20/2012 Procedure: ARTHROSCOPY KNEE; Surgeon: Mauri Pole, MD; Location: [name] [facility]; Service: Orthopedics; Laterality: Left; medial menisectomy  medial patellar condroplasty and sinovectomy Provider TONSILLECTOMY  AGE 58  Provider TRANSTHORACIC ECHOCARDIOGRAM  06-20-2010 NORMAL LVSF/ EF 55% Provider TUBAL LIGATION  1988  Provider  Performing Technologist/Nurse   Performing Technologist/Nurse:  Implants     No active implants to display in this view. Order-Level Documents:   There are no order-level documents.  Encounter-Level Documents:   There are no encounter-level documents.  Printable Result Report    Result Report      Reproductive/Obstetrics                            Anesthesia Physical Anesthesia Plan  ASA: III  Anesthesia Plan: General   Post-op Pain Management:    Induction: Intravenous  PONV Risk Score and Plan: 4 or greater and Ondansetron, Dexamethasone, Midazolam, Scopolamine patch - Pre-op, Propofol infusion and Treatment may vary due to age or medical condition  Airway Management Planned: Oral ETT  Additional Equipment:   Intra-op Plan:   Post-operative Plan: Extubation in OR  Informed Consent: I have reviewed the patients History and Physical, chart, labs and discussed the procedure including the risks, benefits and alternatives for the proposed anesthesia with the patient or authorized representative who has indicated his/her understanding and acceptance.   Dental advisory given  Plan Discussed with: CRNA and Surgeon  Anesthesia Plan Comments:         Anesthesia Quick Evaluation

## 2017-06-29 NOTE — Anesthesia Postprocedure Evaluation (Signed)
Anesthesia Post Note  Patient: [name]  Procedure(s) Performed: Procedure(s) (LRB): LAPAROSCOPIC GASTRIC SLEEVE RESECTION (N/A) HERNIA REPAIR UMBILICAL ADULT     Patient location during evaluation: PACU Anesthesia Type: General Level of consciousness: awake and sedated Pain management: pain level controlled Vital Signs Assessment: post-procedure vital signs reviewed and stable Respiratory status: spontaneous breathing Cardiovascular status: stable Postop Assessment: no signs of nausea or vomiting Anesthetic complications: no    Last Vitals:  Vitals:   06/29/17 1045 06/29/17 1100  BP: (!) 152/79 (!) 157/81  Pulse: (!) 101 (!) 104  Resp: 17 16  Temp:    SpO2: 100% 97%    Last Pain:  Vitals:   06/29/17 1100  TempSrc:   PainSc: Asleep   Pain Goal: Patients Stated Pain Goal: 3 (06/29/17 0531)               Naiomi Musto JR,JOHN Mateo Flow

## 2017-06-30 LAB — CBC WITH DIFFERENTIAL/PLATELET
BASOS ABS: 0 10*3/uL (ref 0.0–0.1)
BASOS PCT: 0 %
Eosinophils Absolute: 0 10*3/uL (ref 0.0–0.7)
Eosinophils Relative: 0 %
HEMATOCRIT: 39 % (ref 36.0–46.0)
HEMOGLOBIN: 13.1 g/dL (ref 12.0–15.0)
LYMPHS PCT: 7 %
Lymphs Abs: 0.7 10*3/uL (ref 0.7–4.0)
MCH: 30 pg (ref 26.0–34.0)
MCHC: 33.6 g/dL (ref 30.0–36.0)
MCV: 89.4 fL (ref 78.0–100.0)
Monocytes Absolute: 0.7 10*3/uL (ref 0.1–1.0)
Monocytes Relative: 7 %
NEUTROS ABS: 8.8 10*3/uL — AB (ref 1.7–7.7)
NEUTROS PCT: 86 %
Platelets: 217 10*3/uL (ref 150–400)
RBC: 4.36 MIL/uL (ref 3.87–5.11)
RDW: 14.4 % (ref 11.5–15.5)
WBC: 10.2 10*3/uL (ref 4.0–10.5)

## 2017-06-30 NOTE — Progress Notes (Signed)
Patient alert and oriented, Post op day 1.  Provided support and encouragement.  Encouraged pulmonary toilet, ambulation and small sips of liquids.  All questions answered.  Will continue to monitor. 

## 2017-06-30 NOTE — Plan of Care (Signed)
Problem: Food- and Nutrition-Related Knowledge Deficit (NB-1.1) Goal: Nutrition education Formal process to instruct or train a patient/client in a skill or to impart knowledge to help patients/clients voluntarily manage or modify food choices and eating behavior to maintain or improve health. Outcome: Completed/Met Date Met: 06/30/17 Nutrition Education Note  Received consult for diet education per DROP protocol.   Discussed 2 week post op diet with pt. Emphasized that liquids must be non carbonated, non caffeinated, and sugar free. Fluid goals discussed. Pt to follow up with outpatient bariatric RD for further diet progression after 2 weeks. Multivitamins and minerals also reviewed. Teach back method used, pt expressed understanding, expect good compliance.   Diet: First 2 Weeks  You will see the nutritionist about two (2) weeks after your surgery. The nutritionist will increase the types of foods you can eat if you are handling liquids well:  If you have severe vomiting or nausea and cannot handle clear liquids lasting longer than 1 day, call your surgeon  Protein Shake  Drink at least 2 ounces of shake 5-6 times per day  Each serving of protein shakes (usually 8 - 12 ounces) should have a minimum of:  15 grams of protein  And no more than 5 grams of carbohydrate  Goal for protein each day:  Men = 80 grams per day  Women = 60 grams per day  Protein powder may be added to fluids such as non-fat milk or Lactaid milk or Soy milk (limit to 35 grams added protein powder per serving)   Hydration  Slowly increase the amount of water and other clear liquids as tolerated (See Acceptable Fluids)  Slowly increase the amount of protein shake as tolerated  Sip fluids slowly and throughout the day  May use sugar substitutes in small amounts (no more than 6 - 8 packets per day; i.e. Splenda)   Fluid Goal  The first goal is to drink at least 8 ounces of protein shake/drink per day (or as directed  by the nutritionist); some examples of protein shakes are Premier Protein, Syntrax Nectar, Adkins Advantage, EAS Edge HP, and Unjury. See handout from pre-op Bariatric Education Class:  Slowly increase the amount of protein shake you drink as tolerated  You may find it easier to slowly sip shakes throughout the day  It is important to get your proteins in first  Your fluid goal is to drink 64 - 100 ounces of fluid daily  It may take a few weeks to build up to this  32 oz (or more) should be clear liquids  And  32 oz (or more) should be full liquids (see below for examples)  Liquids should not contain sugar, caffeine, or carbonation   Clear Liquids:  Water or Sugar-free flavored water (i.e. Fruit H2O, Propel)  Decaffeinated coffee or tea (sugar-free)  Crystal Lite, Wyler's Lite, Minute Maid Lite  Sugar-free Jell-O  Bouillon or broth  Sugar-free Popsicle: *Less than 20 calories each; Limit 1 per day   Full Liquids:  Protein Shakes/Drinks + 2 choices per day of other full liquids  Full liquids must be:  No More Than 12 grams of Carbs per serving  No More Than 3 grams of Fat per serving  Strained low-fat cream soup  Non-Fat milk  Fat-free Lactaid Milk  Sugar-free yogurt (Dannon Lite & Fit, Greek yogurt, Oikos Zero)   Kerri Channing, MS, RD, LDN Pager: [phone] After Hours Pager: [phone]     

## 2017-06-30 NOTE — Discharge Summary (Signed)
Physician Discharge Summary  Patient ID: [name] MRN: [id number] DOB/AGE: [birth year] 51 y.o.  Admit date: 06/29/2017 Discharge date: 06/30/2017  Admission Diagnoses:  Morbid obesity  Discharge Diagnoses:  same  Principal Problem:   S/P laparoscopic sleeve gastrectomy   Surgery:  Lap sleeve gastrectomy and open repair of umbilical hernia  Discharged Condition: improved  Hospital Course:   Had surgery and kept overnight.  Sore in umbilicus.  Ready for discharge on PD 1  Consults: none  Significant Diagnostic Studies: none    Discharge Exam: Blood pressure (!) 148/87, pulse 87, temperature 98.9 F (37.2 C), temperature source Oral, resp. rate 18, height 4' 11.75" (1.518 m), weight 109.6 kg (241 lb 10 oz), last menstrual period 06/21/2015, SpO2 93 %. Incisions OK  Disposition: 01-Home or Self Care  Discharge Instructions    Ambulate hourly while awake    Complete by:  As directed    Call MD for:  difficulty breathing, headache or visual disturbances    Complete by:  As directed    Call MD for:  persistant dizziness or light-headedness    Complete by:  As directed    Call MD for:  persistant nausea and vomiting    Complete by:  As directed    Call MD for:  redness, tenderness, or signs of infection (pain, swelling, redness, odor or green/yellow discharge around incision site)    Complete by:  As directed    Call MD for:  severe uncontrolled pain    Complete by:  As directed    Call MD for:  temperature >101 F    Complete by:  As directed    Diet bariatric full liquid    Complete by:  As directed    Incentive spirometry    Complete by:  As directed    Perform hourly while awake     Allergies as of 06/30/2017      Reactions   Cortisone Hives, Nausea And Vomiting   Naproxen Sodium Hives   aleve   Prednisone Hives      Medication List    TAKE these medications   acetaminophen 500 MG tablet Commonly known as:  TYLENOL Take 1,000 mg by mouth every 8  (eight) hours as needed for mild pain or headache.   ALPRAZolam 0.25 MG tablet Commonly known as:  XANAX Take 0.25 mg by mouth 3 (three) times daily.   amLODipine 2.5 MG tablet Commonly known as:  NORVASC TAKE 1 TABLET BY MOUTH ONCE A DAY Notes to patient:  Monitor Blood Pressure Daily and keep a log for primary care physician.  You may need to make changes to your medications with rapid weight loss.     BIOFLEX Tabs Take 1 tablet by mouth 2 (two) times daily.   flintstones complete 60 MG chewable tablet Chew 1 tablet by mouth daily.   furosemide 20 MG tablet Commonly known as:  LASIX Take 20 mg by mouth daily as needed for fluid or edema. Notes to patient:  Monitor Blood Pressure Daily and keep a log for primary care physician.  Monitor for symptoms of dehydration.  You may need to make changes to your medications with rapid weight loss.     HYDROcodone-acetaminophen 5-325 MG tablet Commonly known as:  NORCO/VICODIN Take 1 tablet by mouth every 4 (four) hours as needed for moderate pain.   megestrol 40 MG tablet Commonly known as:  MEGACE Take 1 tablet (40 mg total) by mouth 2 (two) times daily. 80mg  bid for  1 month then 49mb bid   methocarbamol 500 MG tablet Commonly known as:  ROBAXIN Take 2 tablets (1,000 mg total) by mouth every 6 (six) hours as needed for muscle spasms.   sertraline 100 MG tablet Commonly known as:  ZOLOFT Take 100 mg by mouth at bedtime.   traZODone 50 MG tablet Commonly known as:  DESYREL Take 100 mg by mouth at bedtime.            Discharge Care Instructions        Start     Ordered   06/30/17 0000  Diet bariatric full liquid     06/30/17 1100   06/30/17 0000  Ambulate hourly while awake     06/30/17 1100   06/30/17 0000  Incentive spirometry    Comments:  Perform hourly while awake   06/30/17 1100   06/30/17 0000  Call MD for:  temperature >101 F     06/30/17 1100   06/30/17 0000  Call MD for:  persistant nausea and vomiting      06/30/17 1100   06/30/17 0000  Call MD for:  severe uncontrolled pain     06/30/17 1100   06/30/17 0000  Call MD for:  redness, tenderness, or signs of infection (pain, swelling, redness, odor or green/yellow discharge around incision site)     06/30/17 1100   06/30/17 0000  Call MD for:  difficulty breathing, headache or visual disturbances     06/30/17 1100   06/30/17 0000  Call MD for:  persistant dizziness or light-headedness     06/30/17 1100     Follow-up Information    Johnathan Hausen, MD. Go on 07/21/2017.   Specialty:  General Surgery Why:  at [facility] information: [address] [place] [address] [phone]        Johnathan Hausen, MD Follow up.   Specialty:  General Surgery Contact information: [place] [address] [phone]           Signed: Pedro Earls 06/30/2017, 11:00 AM

## 2017-06-30 NOTE — Progress Notes (Signed)
Discharge instruction reviewed with patient and sister.  Patient verbalized understanding.  Cassey Bacigalupo RN

## 2017-06-30 NOTE — Progress Notes (Signed)
Patient alert and oriented, pain is controlled. Patient is tolerating fluids, advanced to protein shake today, patient is tolerating well.  Reviewed Gastric sleeve discharge instructions with patient and patient is able to articulate understanding.  Provided information on BELT program, Support Group and [facility] outpatient pharmacy. All questions answered, will continue to monitor.  

## 2017-07-08 ENCOUNTER — Telehealth (HOSPITAL_COMMUNITY): Payer: Self-pay

## 2017-07-08 NOTE — Telephone Encounter (Signed)
Made discharge phone call to patient. Asking the following questions.    1. Do you have someone to care for you now that you are home?  independent 2. Are you having pain now that is not relieved by your pain medication?  No pain medication 3. Are you able to drink the recommended daily amount of fluids (48 ounces minimum/day) and protein (60-80 grams/day) as prescribed by the dietitian or nutritional counselor?  48 ounces of fluid, 60 grams of protein 4. Are you taking the vitamins and minerals as prescribed?  yes 5. Do you have the "on call" number to contact your surgeon if you have a problem or question?  yes 6. Are your incisions free of redness, swelling or drainage? (If steri strips, address that these can fall off, shower as tolerated) yes 7. Have your bowels moved since your surgery?  If not, are you passing gas?  yes 8. Are you up and walking 3-4 times per day? yes  9. Were you provided your discharge medications before your surgery or before you were discharged from the hospital and are you taking them without problem?  yes

## 2017-07-13 ENCOUNTER — Encounter: Payer: BLUE CROSS/BLUE SHIELD | Attending: Surgery | Admitting: Registered"

## 2017-07-13 DIAGNOSIS — Z713 Dietary counseling and surveillance: Secondary | ICD-10-CM | POA: Insufficient documentation

## 2017-07-13 DIAGNOSIS — E669 Obesity, unspecified: Secondary | ICD-10-CM

## 2017-07-14 NOTE — Progress Notes (Signed)
Bariatric Class:  Appt start time: 1530 end time:  1630.  2 Week Post-Operative Nutrition Class  Patient was seen on 07/13/2017 for Post-Operative Nutrition education at the [facility].   Surgery date: 06/29/2017 Surgery type: Sleeve gastrectomy Start weight at [facility]: 245.9 lbs Weight today: 228.4 lbs Weight change: 17.5 lbs loss  TANITA  BODY COMP RESULTS  07/13/2017   BMI (kg/m^2) 45.4   Fat Mass (lbs) 118.0   Fat Free Mass (lbs) 110.4   Total Body Water (lbs) 80.4    Pt states she is experiencing some gas.   The following the learning objectives were met by the patient during this course:  Identifies Phase 3A (Soft, High Proteins) Dietary Goals and will begin from 2 weeks post-operatively to 2 months post-operatively  Identifies appropriate sources of fluids and proteins   States protein recommendations and appropriate sources post-operatively  Identifies the need for appropriate texture modifications, mastication, and bite sizes when consuming solids  Identifies appropriate multivitamin and calcium sources post-operatively  Describes the need for physical activity post-operatively and will follow MD recommendations  States when to call healthcare provider regarding medication questions or post-operative complications  Handouts given during class include:  Phase 3A: Soft, High Protein Diet Handout  Follow-Up Plan: Patient will follow-up at [facility] in 6 weeks for 2 month post-op nutrition visit for diet advancement per MD.    

## 2017-08-25 ENCOUNTER — Encounter: Payer: BLUE CROSS/BLUE SHIELD | Attending: Surgery | Admitting: Skilled Nursing Facility1

## 2017-08-25 ENCOUNTER — Encounter: Payer: Self-pay | Admitting: Skilled Nursing Facility1

## 2017-08-25 DIAGNOSIS — Z713 Dietary counseling and surveillance: Secondary | ICD-10-CM | POA: Diagnosis present

## 2017-08-25 DIAGNOSIS — E669 Obesity, unspecified: Secondary | ICD-10-CM

## 2017-08-25 NOTE — Progress Notes (Signed)
Follow-up visit:  8 Weeks Post-Operative Sleeve Surgery  Primary concerns today: Post-operative Bariatric Surgery Nutrition Management.  Pt states she is very excited and can now reach her feet stating surgery was the best decision she has ever made. Pt states she plans on being under 200 pounds next month. Pt states she has thrown up with ribs, pork BBQ, and chicken.  Pt states diarrhea is normal for her. Pt states she is having 3 bowel movements a day. 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   Surgery date: 06/29/2017 Surgery type: Sleeve gastrectomy Start weight at [facility]: 245.9 lbs Weight today: 228.4 lbs Weight change: 11 lbs loss  TANITA  BODY COMP RESULTS  07/13/2017 08/25/2017   BMI (kg/m^2) 45.4 42.5   Fat Mass (lbs) 118.0 102.8   Fat Free Mass (lbs) 110.4 114.6   Total Body Water (lbs) 80.4 83    24-hr recall: B (AM): 1 egg and cheese (10g) Snk (AM): cheese L (PM): 9 shrimp (21g) Snk (PM): premier protein bar D (PM): 9 shrimp (21g) or wendys chili  Snk (PM): sugar free popcicle or popcorn  Fluid intake: fairlife milk, water, protein shake sometimes: 6 16 ounces of water  Estimated total protein intake: 60+  Medications: stopped her blood pressure medicine  Supplementation: bariatric advantage and calcium and flinestone with iron   Using straws: no Drinking while eating: no Having you been chewing well: yes Chewing/swallowing difficulties: no Changes in vision: no Changes to mood/headaches: no Hair loss/Cahnges to skin/Changes to nails: no Any difficulty focusing or concentrating: no Sweating: no Dizziness/Lightheaded: no Palpitations: no  Carbonated beverages: no N/V/D/C/GAS: diarrhea and vomiting with certain foods  Abdominal Pain: no Dumping syndrome: no   Recent physical activity:  BELT  Progress Towards Goal(s):  In progress.  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    Intervention:  Nutrition counseling. Dietitian educated the pt on advancing her diet to include non-starchy veggies + protein. Goals: -Stop taking the flinestone vitamins  -Always eat you protein first then start in on your vegetables  -Be sure to keep getting in your water to keep from getting dehydrated  -Try kefir in the yogurt aisle and have 4 ounces every morning  Teaching Method Utilized:  Visual Auditory Hands on  Barriers to learning/adherence to lifestyle change: none identified   Demonstrated degree of understanding via:  Teach Back   Monitoring/Evaluation:  Dietary intake, exercise, and body weight.

## 2017-08-25 NOTE — Patient Instructions (Addendum)
-  Stop taking the flinestone vitamins   -Always eat you protein first then start in on your vegetables   -Be sure to keep getting in your water to keep from getting dehydrated   -Try kefir in the yogurt aisle and have 4 ounces every morning

## 2017-08-26 ENCOUNTER — Encounter (HOSPITAL_COMMUNITY): Payer: Self-pay | Admitting: Emergency Medicine

## 2017-08-26 ENCOUNTER — Observation Stay (HOSPITAL_COMMUNITY)
Admission: EM | Admit: 2017-08-26 | Discharge: 2017-08-27 | Disposition: A | Discharge status: Home / Self Care | Payer: BLUE CROSS/BLUE SHIELD | Attending: Internal Medicine | Admitting: Internal Medicine

## 2017-08-26 ENCOUNTER — Emergency Department (HOSPITAL_COMMUNITY): Payer: BLUE CROSS/BLUE SHIELD

## 2017-08-26 DIAGNOSIS — Z9884 Bariatric surgery status: Secondary | ICD-10-CM | POA: Insufficient documentation

## 2017-08-26 DIAGNOSIS — Z79899 Other long term (current) drug therapy: Secondary | ICD-10-CM | POA: Insufficient documentation

## 2017-08-26 DIAGNOSIS — Z9049 Acquired absence of other specified parts of digestive tract: Secondary | ICD-10-CM | POA: Diagnosis not present

## 2017-08-26 DIAGNOSIS — F4310 Post-traumatic stress disorder, unspecified: Secondary | ICD-10-CM | POA: Diagnosis not present

## 2017-08-26 DIAGNOSIS — Z90710 Acquired absence of both cervix and uterus: Secondary | ICD-10-CM | POA: Insufficient documentation

## 2017-08-26 DIAGNOSIS — R079 Chest pain, unspecified: Principal | ICD-10-CM | POA: Insufficient documentation

## 2017-08-26 DIAGNOSIS — R110 Nausea: Secondary | ICD-10-CM

## 2017-08-26 DIAGNOSIS — Z888 Allergy status to other drugs, medicaments and biological substances status: Secondary | ICD-10-CM | POA: Diagnosis not present

## 2017-08-26 DIAGNOSIS — R112 Nausea with vomiting, unspecified: Secondary | ICD-10-CM | POA: Insufficient documentation

## 2017-08-26 DIAGNOSIS — G4733 Obstructive sleep apnea (adult) (pediatric): Secondary | ICD-10-CM | POA: Insufficient documentation

## 2017-08-26 DIAGNOSIS — R55 Syncope and collapse: Secondary | ICD-10-CM | POA: Insufficient documentation

## 2017-08-26 DIAGNOSIS — Z6841 Body Mass Index (BMI) 40.0 and over, adult: Secondary | ICD-10-CM | POA: Diagnosis not present

## 2017-08-26 DIAGNOSIS — R0789 Other chest pain: Secondary | ICD-10-CM | POA: Diagnosis present

## 2017-08-26 LAB — CBC
HCT: 38.1 % (ref 36.0–46.0)
HEMOGLOBIN: 12.8 g/dL (ref 12.0–15.0)
MCH: 30.5 pg (ref 26.0–34.0)
MCHC: 33.6 g/dL (ref 30.0–36.0)
MCV: 90.7 fL (ref 78.0–100.0)
Platelets: 226 10*3/uL (ref 150–400)
RBC: 4.2 MIL/uL (ref 3.87–5.11)
RDW: 13.8 % (ref 11.5–15.5)
WBC: 6.6 10*3/uL (ref 4.0–10.5)

## 2017-08-26 LAB — BASIC METABOLIC PANEL
ANION GAP: 11 (ref 5–15)
BUN: 16 mg/dL (ref 6–20)
CALCIUM: 9.3 mg/dL (ref 8.9–10.3)
CO2: 25 mmol/L (ref 22–32)
Chloride: 106 mmol/L (ref 101–111)
Creatinine, Ser: 0.62 mg/dL (ref 0.44–1.00)
GLUCOSE: 95 mg/dL (ref 65–99)
Potassium: 3.6 mmol/L (ref 3.5–5.1)
SODIUM: 142 mmol/L (ref 135–145)

## 2017-08-26 LAB — POCT I-STAT TROPONIN I: TROPONIN I, POC: 0 ng/mL (ref 0.00–0.08)

## 2017-08-26 MED ORDER — MORPHINE SULFATE (PF) 4 MG/ML IV SOLN
4.0000 mg | Freq: Once | INTRAVENOUS | Status: AC
  Administered 2017-08-26: 4 mg via INTRAVENOUS
  Filled 2017-08-26: qty 1

## 2017-08-26 MED ORDER — ASPIRIN 81 MG PO CHEW
324.0000 mg | CHEWABLE_TABLET | Freq: Once | ORAL | Status: AC
  Administered 2017-08-26: 324 mg via ORAL
  Filled 2017-08-26: qty 4

## 2017-08-26 MED ORDER — GI COCKTAIL ~~LOC~~
30.0000 mL | Freq: Once | ORAL | Status: AC
  Administered 2017-08-26: 30 mL via ORAL
  Filled 2017-08-26: qty 30

## 2017-08-26 MED ORDER — IOPAMIDOL (ISOVUE-370) INJECTION 76%
INTRAVENOUS | Status: AC
  Administered 2017-08-26: 100 mL via INTRAVENOUS
  Filled 2017-08-26: qty 100

## 2017-08-26 MED ORDER — PANTOPRAZOLE SODIUM 40 MG PO TBEC: 40.0000 mg | DELAYED_RELEASE_TABLET | Freq: Every day | ORAL | Status: DC

## 2017-08-26 NOTE — H&P (Addendum)
TRH H&P   Patient Demographics:    [name], is a 51 y.o. female  MRN: [id number]   DOB - [birth year]  Admit Date - 08/26/2017  Outpatient Primary MD for the patient is Lawerance Cruel, MD  Referring MD/NP/PA:   Brantley Stage  Outpatient Specialists:  Johnathan Hausen (surgery)  Patient coming from: home  Chief Complaint  Patient presents with  . Chest Pain  . Jaw Pain  . Numbness      HPI:    [name]  is a 51 y.o. female, w morbid obeisity apparently c/o chest pain starting about 5:50pm while sitting on the commode. Had n/v x1 , no bloody emesis.  Pain radiated from left to right shoulder  "tightness", and also radiated down the left arm.  Denies fever, chilll, cough, palp, dyspnea.  Pt presented to ED for evaluation.   In ED,  Wbc 6.6, Hgb 12.8, Plt 226,  Bun 16, Creatinine 0.62  CTA chest Review of the MIP images confirms the above findings.  IMPRESSION: 1. No aortic aneurysm or dissection. 2. No pulmonary emboli. 3. Minimal interstitial edema or atelectasis in the lower lobes, greater on the left. 4. Diffuse hepatic steatosis. 5. Colonic diverticulosis. 6. Small ventral hernia containing fat. 7. Surgical repair of the previously demonstrated umbilical hernia containing fat with interval soft tissue edema or scarring in the fat.   Troponin 0.00  Ekg nsr at 80, nl axis, early R progression t inversion in v2.   Pt will be admitted for chest pain.     Review of systems:    In addition to the HPI above, No Fever-chills, No Headache, No changes with Vision or hearing, No problems swallowing food or Liquids, No Cough or Shortness of Breath, No Abdominal pain,  Bowel movements are regular, No Blood in stool or Urine, +GERD No dysuria, No new skin rashes or bruises, No new joints pains-aches,  No new weakness, tingling, numbness in any  extremity, No recent weight gain or loss, No polyuria, polydypsia or polyphagia, No significant Mental Stressors.  A full 10 point Review of Systems was done, except as stated above, all other Review of Systems were negative.   With Past History of the following :    Past Medical History:  Diagnosis Date  . Anxiety   . Cancer ([place]) 1990   Cervical  . Carpal tunnel syndrome, bilateral   . Complication of anesthesia INTRAOP HYPERTENSION W/ TONSILLECTOMY SURGERY AGE 14--  NO ISSUES SINCE   Pt stated that she stopped breathing due. Was later diagnoses with Sleep Apnea, Didnot obtain a CPAP machince due to cost  . Left knee pain   . Sleep apnea       Past Surgical History:  Procedure Laterality Date  . ABDOMINAL HYSTERECTOMY     May 15, 2016  . ANTERIOR CERVICAL DECOMP/DISCECTOMY FUSION  10-16-2010   C4  -  C6  . CERVICAL BIOPSY  W/ LOOP ELECTRODE EXCISION  2010  . CESAREAN SECTION  1987  . CHOLECYSTECTOMY  2009  . FOOT SURGERY  2010   LEFT  . KNEE ARTHROSCOPY  10/20/2012   Procedure: ARTHROSCOPY KNEE;  Surgeon: Mauri Pole, MD;  Location: [facility];  Service: Orthopedics;  Laterality: Left;  medial menisectomy medial patellar condroplasty and sinovectomy  . LAPAROSCOPIC GASTRIC SLEEVE RESECTION N/A 06/29/2017   Procedure: LAPAROSCOPIC GASTRIC SLEEVE RESECTION;  Surgeon: Johnathan Hausen, MD;  Location: [facility];  Service: General;  Laterality: N/A;  . TONSILLECTOMY  AGE 10  . TRANSTHORACIC ECHOCARDIOGRAM  06-20-2010   NORMAL LVSF/ EF 55%  . TUBAL LIGATION  1988  . UMBILICAL HERNIA REPAIR  06/29/2017   Procedure: HERNIA REPAIR UMBILICAL ADULT;  Surgeon: Johnathan Hausen, MD;  Location: [facility];  Service: General;;      Social History:     Social History  Substance Use Topics  . Smoking status: Never Smoker  . Smokeless tobacco: Never Used  . Alcohol use No     Lives - at home  Mobility -  Walks by self   Family History :     Family History   Problem Relation Age of Onset  . Adopted: Yes  . Cancer Mother   . Diabetes Mother   . Cancer Paternal Grandmother       Home Medications:   Prior to Admission medications   Medication Sig Start Date End Date Taking? Authorizing Provider  ALPRAZolam Duanne Moron) 0.5 MG tablet Take 0.5 mg by mouth 3 (three) times daily.   Yes [provider]  B Complex-Iron-Minerals (GERIATRIC VITAMIN PO) Take 1 tablet by mouth daily.   Yes [provider]  calcium carbonate (CALTRATE 600) 1500 (600 Ca) MG TABS tablet Take 1 tablet by mouth 3 (three) times daily with meals.   Yes [provider]  furosemide (LASIX) 20 MG tablet Take 20 mg by mouth daily as needed for fluid or edema.   Yes [provider]  nystatin cream (MYCOSTATIN) APPLY TO AFFECTED AREA AS NEEDED DAILY AS NEEDED FOR RASH 07/21/17  Yes [provider]  pantoprazole (PROTONIX) 40 MG tablet Take 40 mg by mouth daily. 07/25/17  Yes [provider]  sertraline (ZOLOFT) 100 MG tablet Take 200 mg by mouth at bedtime.    Yes [provider]  traZODone (DESYREL) 50 MG tablet Take 100 mg by mouth at bedtime.    Yes [provider]  acetaminophen (TYLENOL) 500 MG tablet Take 1,000 mg by mouth every 8 (eight) hours as needed for mild pain or headache.    [provider]  HYDROcodone-acetaminophen (NORCO/VICODIN) 5-325 MG tablet Take 1 tablet by mouth every 4 (four) hours as needed for moderate pain. Patient not taking: Reported on 06/22/2017 12/11/15   Julianne Rice, MD  megestrol (MEGACE) 40 MG tablet Take 1 tablet (40 mg total) by mouth 2 (two) times daily. 80mg  bid for 1 month then 56mb bid Patient not taking: Reported on 06/22/2017 08/21/15   Emily Filbert, MD  methocarbamol (ROBAXIN) 500 MG tablet Take 2 tablets (1,000 mg total) by mouth every 6 (six) hours as needed for muscle spasms. Patient not taking: Reported on 06/22/2017 12/11/15   Julianne Rice, MD     Allergies:      Allergies  Allergen Reactions  . Cortisone Hives and Nausea And Vomiting  . Naproxen Sodium Hives    aleve  . Prednisone Hives  Physical Exam:   Vitals  Blood pressure 123/74, pulse 75, temperature 98 F (36.7 C), temperature source Oral, resp. rate 15, height 5' (1.524 m), weight 96.6 kg (213 lb), last menstrual period 06/21/2015, SpO2 98 %.   1. General  lying in bed in NAD,    2. Normal affect and insight, Not Suicidal or Homicidal, Awake Alert, Oriented X 3.  3. No F.N deficits, ALL C.Nerves Intact, Strength 5/5 all 4 extremities, Sensation intact all 4 extremities, Plantars down going.  4. Ears and Eyes appear Normal, Conjunctivae clear, PERRLA. Moist Oral Mucosa.  5. Supple Neck, No JVD, No cervical lymphadenopathy appriciated, No Carotid Bruits.  6. Symmetrical Chest wall movement, Good air movement bilaterally, CTAB.  7. RRR, No Gallops, Rubs or Murmurs, No Parasternal Heave.  8. Positive Bowel Sounds, Abdomen Soft, No tenderness, No organomegaly appriciated,No rebound -guarding or rigidity.  9.  No Cyanosis, Normal Skin Turgor, No Skin Rash or Bruise.  10. Good muscle tone,  joints appear normal , no effusions, Normal ROM.  11. No Palpable Lymph Nodes in Neck or Axillae     Data Review:    CBC  Recent Labs Lab 08/26/17 1951  WBC 6.6  HGB 12.8  HCT 38.1  PLT 226  MCV 90.7  MCH 30.5  MCHC 33.6  RDW 13.8   ------------------------------------------------------------------------------------------------------------------  Chemistries   Recent Labs Lab 08/26/17 1951  NA 142  K 3.6  CL 106  CO2 25  GLUCOSE 95  BUN 16  CREATININE 0.62  CALCIUM 9.3   ------------------------------------------------------------------------------------------------------------------ estimated creatinine clearance is 87.5 mL/min (by C-G formula based on SCr of 0.62  mg/dL). ------------------------------------------------------------------------------------------------------------------ No results for input(s): TSH, T4TOTAL, T3FREE, THYROIDAB in the last 72 hours.  Invalid input(s): FREET3  Coagulation profile No results for input(s): INR, PROTIME in the last 168 hours. ------------------------------------------------------------------------------------------------------------------- No results for input(s): DDIMER in the last 72 hours. -------------------------------------------------------------------------------------------------------------------  Cardiac Enzymes No results for input(s): CKMB, TROPONINI, MYOGLOBIN in the last 168 hours.  Invalid input(s): CK ------------------------------------------------------------------------------------------------------------------    Component Value Date/Time   BNP 25.7 12/12/2015 1509     ---------------------------------------------------------------------------------------------------------------  Urinalysis    Component Value Date/Time   COLORURINE YELLOW 06/25/2015 1330   APPEARANCEUR CLOUDY (A) 06/25/2015 1330   LABSPEC 1.017 06/25/2015 1330   PHURINE 7.0 06/25/2015 1330   GLUCOSEU NEGATIVE 06/25/2015 1330   HGBUR LARGE (A) 06/25/2015 1330   BILIRUBINUR NEGATIVE 06/25/2015 1330   KETONESUR NEGATIVE 06/25/2015 1330   PROTEINUR NEGATIVE 06/25/2015 1330   UROBILINOGEN 0.2 06/25/2015 1330   NITRITE NEGATIVE 06/25/2015 1330   LEUKOCYTESUR NEGATIVE 06/25/2015 1330    ----------------------------------------------------------------------------------------------------------------   Imaging Results:    Dg Chest 2 View  Result Date: 08/26/2017 CLINICAL DATA:  51 year old female with chest pain. Multiple episodes of emesis. EXAM: CHEST  2 VIEW COMPARISON:  Chest radiograph dated 05/02/2017 FINDINGS: The lungs are clear. There is no pleural effusion or pneumothorax. Stable top-normal cardiac  silhouette. Cervical fusion plate and screws noted. Degenerative changes of the spine. No acute osseous pathology. IMPRESSION: No active cardiopulmonary disease. Electronically Signed   By: Anner Crete M.D.   On: 08/26/2017 20:22   Ct Angio Chest/abd/pel For Dissection W And/or Wo Contrast  Result Date: 08/26/2017 CLINICAL DATA:  Chest pain. 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 Episodes of nausea and vomiting today. Clinical concern for aortic dissection. EXAM: CT ANGIOGRAPHY CHEST, ABDOMEN AND PELVIS TECHNIQUE: Multidetector CT imaging through the chest, abdomen and pelvis was performed using the standard protocol during bolus  administration of intravenous contrast. Multiplanar reconstructed images and MIPs were obtained and reviewed to evaluate the vascular anatomy. CONTRAST:  100 cc Isovue 370 COMPARISON:  Chest radiographs obtained today. Chest CTA dated 12/12/2015. Abdomen and pelvis CT dated 06/25/2015. FINDINGS: CTA CHEST FINDINGS Cardiovascular: Normal appearing thoracic aorta without aneurysm or dissection. Normally opacified pulmonary arteries with no pulmonary arterial filling defects. Mildly enlarged heart. No pericardial effusion. Mediastinum/Nodes: No enlarged mediastinal, hilar, or axillary lymph nodes. Thyroid gland, trachea, and esophagus demonstrate no significant findings. Lungs/Pleura: Interval minimal patchy interstitial prominence at the lung bases, greater on the left. No pleural fluid. Musculoskeletal: Thoracic spine degenerative changes. Cervical spine fixation hardware. Review of the MIP images confirms the above findings. CTA ABDOMEN AND PELVIS FINDINGS VASCULAR Aorta: Normal, without aneurysm or dissection. Celiac: Normal SMA: Normal Renals: Normal IMA: Normal Inflow: Normal Veins: No obvious venous abnormality within the limitations of this arterial phase study. Review of the MIP images confirms the above findings. NON-VASCULAR Hepatobiliary: Diffuse low  density of the liver. Cholecystectomy clips. Pancreas: Unremarkable. No pancreatic ductal dilatation or surrounding inflammatory changes. Spleen: Normal in size without focal abnormality. Adrenals/Urinary Tract: Adrenal glands are unremarkable. Kidneys are normal, without renal calculi, focal lesion, or hydronephrosis. Bladder is unremarkable. Stomach/Bowel: Post gastric sleeve changes. Multiple colonic diverticula. Normal appearing small bowel and appendix. Lymphatic: No enlarged lymph nodes. Reproductive: Status post hysterectomy. No adnexal masses. Other: Small ventral hernia containing fat. Edema or scarring in the fat of the previously demonstrated umbilical hernia containing fat. The patient had this surgically repaired on 06/29/2017. Musculoskeletal: Lumbar spine degenerative changes. Review of the MIP images confirms the above findings. IMPRESSION: 1. No aortic aneurysm or dissection. 2. No pulmonary emboli. 3. Minimal interstitial edema or atelectasis in the lower lobes, greater on the left. 4. Diffuse hepatic steatosis. 5. Colonic diverticulosis. 6. Small ventral hernia containing fat. 7. Surgical repair of the previously demonstrated umbilical hernia containing fat with interval soft tissue edema or scarring in the fat. Electronically Signed   By: Claudie Revering M.D.   On: 08/26/2017 22:11     Assessment & Plan:    Active Problems:   Chest pain   Nausea    Cp Tele Trop I q3h x3 Echo Nuclear stress test Cardiology consulted by email   Gerd/ Nausea protonix  zofran  PTSD/Anxiety Cont zoloft Cont xanax Cont trazadone      DVT Prophylaxis Lovenox - SCDs  AM Labs Ordered, also please review Full Orders  Family Communication: Admission, patients condition and plan of care including tests being ordered have been discussed with the patient  who indicate understanding and agree with the plan and Code Status.  Code Status FULL CODE  Likely DC to  home  Condition GUARDED    Consults called: cardiology by email for stress testing  Admission status: observation  Time spent in minutes : 45   Jani Gravel M.D on 08/26/2017 at 11:09 PM  Between 7am to 7pm - Pager - [phone]. After 7pm go to [email or web address] - password [facility]  [facility] - Office  [phone]

## 2017-08-26 NOTE — ED Notes (Signed)
Patient transported to CT 

## 2017-08-26 NOTE — ED Triage Notes (Signed)
Pt reports having multiple episodes of emesis and then around 1815 chest pain radiated from left shoulder across chest to right shoulder. Pt also reports numbness into left arm and face that started at the same time.

## 2017-08-26 NOTE — ED Provider Notes (Signed)
[facility] DEPT Provider Note   CSN: [id number] Arrival date & time: 08/26/17  1854     History   Chief Complaint Chief Complaint  Patient presents with  . Chest Pain  . Jaw Pain  . Numbness    HPI [name] is a 51 y.o. female.  The history is provided by the patient.  Chest Pain   This is a new problem. The current episode started 1 to 2 hours ago. The problem occurs constantly. The problem has not changed since onset.Associated with: nausea/vomiting. The pain is present in the substernal region. The pain is severe. The quality of the pain is described as stabbing and burning. The pain radiates to the left jaw, left shoulder and left arm. Associated symptoms include back pain, diaphoresis, nausea and near-syncope. Pertinent negatives include no abdominal pain, no shortness of breath and no syncope. She has tried nothing for the symptoms. The treatment provided no relief. Risk factors include obesity.  Her family medical history is significant for CAD.   51 year old female who presents with chest pain.She has a history of obesity status post sleeve gastrectomy and cholecystectomy. States that she retrieved we will have nausea, vomiting and diarrhea associated with her sleeve gastrectomy. Today did have episode of nausea and vomiting; however, she subsequently developed severe sharp chest pains that radiated across her chest and towards the back. She felt very lightheaded, but did not have syncope. States that she had burning and numbing pain subsequently that radiated the left jaw and then the left arm.she denies any dyspnea. Has not had similar symptoms before in the past. No aggravating or alleviating factors.   Past Medical History:  Diagnosis Date  . Anxiety   . Cancer ([place]) 1990   Cervical  . Carpal tunnel syndrome, bilateral   . Complication of anesthesia INTRAOP HYPERTENSION W/ TONSILLECTOMY SURGERY AGE 25--  NO ISSUES SINCE   Pt stated  that she stopped breathing due. Was later diagnoses with Sleep Apnea, Didnot obtain a CPAP machince due to cost  . Left knee pain   . Sleep apnea     Patient Active Problem List   Diagnosis Date Noted  . Chest pain 08/26/2017  . Nausea 08/26/2017  . 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 S/P left knee arthroscopy 10/20/2012    Past Surgical History:  Procedure Laterality Date  . ABDOMINAL HYSTERECTOMY     May 15, 2016  . ANTERIOR CERVICAL DECOMP/DISCECTOMY FUSION  10-16-2010   C4  -  C6  . CERVICAL BIOPSY  W/ LOOP ELECTRODE EXCISION  2010  . CESAREAN SECTION  1987  . CHOLECYSTECTOMY  2009  . FOOT SURGERY  2010   LEFT  . KNEE ARTHROSCOPY  10/20/2012   Procedure: ARTHROSCOPY KNEE;  Surgeon: Mauri Pole, MD;  Location: [facility];  Service: Orthopedics;  Laterality: Left;  medial menisectomy medial patellar condroplasty and sinovectomy  . LAPAROSCOPIC GASTRIC SLEEVE RESECTION N/A 06/29/2017   Procedure: LAPAROSCOPIC GASTRIC SLEEVE RESECTION;  Surgeon: Johnathan Hausen, MD;  Location: [facility];  Service: General;  Laterality: N/A;  . TONSILLECTOMY  AGE 25  . TRANSTHORACIC ECHOCARDIOGRAM  06-20-2010   NORMAL LVSF/ EF 55%  . TUBAL LIGATION  1988  . UMBILICAL HERNIA REPAIR  06/29/2017   Procedure: HERNIA REPAIR UMBILICAL ADULT;  Surgeon: Johnathan Hausen, MD;  Location: [facility];  Service: General;;    OB History    No data available       Home Medications  Prior to Admission medications   Medication Sig Start Date End Date Taking? Authorizing Provider  ALPRAZolam Duanne Moron) 0.5 MG tablet Take 0.5 mg by mouth 3 (three) times daily.   Yes [provider]  B Complex-Iron-Minerals (GERIATRIC VITAMIN PO) Take 1 tablet by mouth daily.   Yes [provider]  calcium carbonate (CALTRATE 600) 1500 (600 Ca) MG TABS tablet Take 1 tablet by mouth 3 (three) times daily with meals.   Yes [provider]  furosemide (LASIX) 20 MG tablet Take  20 mg by mouth daily as needed for fluid or edema.   Yes [provider]  nystatin cream (MYCOSTATIN) APPLY TO AFFECTED AREA AS NEEDED DAILY AS NEEDED FOR RASH 07/21/17  Yes [provider]  pantoprazole (PROTONIX) 40 MG tablet Take 40 mg by mouth daily. 07/25/17  Yes [provider]  sertraline (ZOLOFT) 100 MG tablet Take 200 mg by mouth at bedtime.    Yes [provider]  traZODone (DESYREL) 50 MG tablet Take 100 mg by mouth at bedtime.    Yes [provider]  acetaminophen (TYLENOL) 500 MG tablet Take 1,000 mg by mouth every 8 (eight) hours as needed for mild pain or headache.    [provider]  HYDROcodone-acetaminophen (NORCO/VICODIN) 5-325 MG tablet Take 1 tablet by mouth every 4 (four) hours as needed for moderate pain. Patient not taking: Reported on 06/22/2017 12/11/15   Julianne Rice, MD  megestrol (MEGACE) 40 MG tablet Take 1 tablet (40 mg total) by mouth 2 (two) times daily. 80mg  bid for 1 month then 17mb bid Patient not taking: Reported on 06/22/2017 08/21/15   Emily Filbert, MD  methocarbamol (ROBAXIN) 500 MG tablet Take 2 tablets (1,000 mg total) by mouth every 6 (six) hours as needed for muscle spasms. Patient not taking: Reported on 06/22/2017 12/11/15   Julianne Rice, MD    Family History Family History  Problem Relation Age of Onset  . Adopted: Yes  . Cancer Mother   . Diabetes Mother   . Cancer Paternal Grandmother     Social History Social History  Substance Use Topics  . Smoking status: Never Smoker  . Smokeless tobacco: Never Used  . Alcohol use No     Allergies   Cortisone; Naproxen sodium; and Prednisone   Review of Systems Review of Systems  Constitutional: Positive for diaphoresis.  Respiratory: Negative for shortness of breath.   Cardiovascular: Positive for chest pain and near-syncope. Negative for syncope.  Gastrointestinal: Positive for nausea. Negative for abdominal pain.  Musculoskeletal:  Positive for back pain.     Physical Exam Updated Vital Signs BP 123/74 (BP Location: Right Arm)   Pulse 75   Temp 98 F (36.7 C) (Oral)   Resp 15   Ht 5' (1.524 m)   Wt 96.6 kg (213 lb)   LMP 06/21/2015   SpO2 98%   BMI 41.60 kg/m   Physical Exam Physical Exam  Nursing note and vitals reviewed. Constitutional:  non-toxic, and in no acute distress Head: Normocephalic and atraumatic.  Mouth/Throat: Oropharynx is clear and moist.  Neck: Normal range of motion. Neck supple.  Cardiovascular: Normal rate and regular rhythm.   Pulmonary/Chest: Effort normal and breath sounds normal.  Abdominal: Soft. There is no tenderness. There is no rebound and no guarding.  Musculoskeletal: Normal range of motion.  Skin: Skin is warm and dry.  Psychiatric: Cooperative Neurological:  Alert, oriented to person, place, time, and situation. Memory grossly in tact. Fluent speech. No  dysarthria or aphasia.  Cranial nerves: VF are full.  EOMI without nystagmus. No gaze deviation. Facial muscles symmetric with activation. Sensation to light touch over face in tact bilaterally. Hearing grossly in tact. Palate elevates symmetrically. Head turn and shoulder shrug are intact. Tongue midline.  Reflexes defered.  Muscle bulk and tone normal. No pronator drift. Moves all extremities symmetrically. Sensation to light touch is in tact throughout in bilateral upper and lower extremities. Coordination reveals no dysmetria with finger to nose. Gait is narrow-based and steady. Non-ataxic. ]  ED Treatments / Results  Labs (all labs ordered are listed, but only abnormal results are displayed) Labs Reviewed  BASIC METABOLIC PANEL  CBC  I-STAT TROPONIN, ED  POCT I-STAT TROPONIN I    EKG  EKG Interpretation  Date/Time:  Thursday August 26 2017 19:37:06 EDT Ventricular Rate:  80 PR Interval:    QRS Duration: 87 QT Interval:  390 QTC Calculation: 450 R Axis:   56 Text Interpretation:  Sinus rhythm Low  voltage, precordial leads Nonspecific T abnormalities, anterior leads Similar to previous EKG Confirmed by Brantley Stage [phone]) on 08/26/2017 8:31:17 PM       Radiology Dg Chest 2 View  Result Date: 08/26/2017 CLINICAL DATA:  51 year old female with chest pain. Multiple episodes of emesis. EXAM: CHEST  2 VIEW COMPARISON:  Chest radiograph dated 05/02/2017 FINDINGS: The lungs are clear. There is no pleural effusion or pneumothorax. Stable top-normal cardiac silhouette. Cervical fusion plate and screws noted. Degenerative changes of the spine. No acute osseous pathology. IMPRESSION: No active cardiopulmonary disease. Electronically Signed   By: Anner Crete M.D.   On: 08/26/2017 20:22   Ct Angio Chest/abd/pel For Dissection W And/or Wo Contrast  Result Date: 08/26/2017 CLINICAL DATA:  Chest pain. 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 Episodes of nausea and vomiting today. Clinical concern for aortic dissection. EXAM: CT ANGIOGRAPHY CHEST, ABDOMEN AND PELVIS TECHNIQUE: Multidetector CT imaging through the chest, abdomen and pelvis was performed using the standard protocol during bolus administration of intravenous contrast. Multiplanar reconstructed images and MIPs were obtained and reviewed to evaluate the vascular anatomy. CONTRAST:  100 cc Isovue 370 COMPARISON:  Chest radiographs obtained today. Chest CTA dated 12/12/2015. Abdomen and pelvis CT dated 06/25/2015. FINDINGS: CTA CHEST FINDINGS Cardiovascular: Normal appearing thoracic aorta without aneurysm or dissection. Normally opacified pulmonary arteries with no pulmonary arterial filling defects. Mildly enlarged heart. No pericardial effusion. Mediastinum/Nodes: No enlarged mediastinal, hilar, or axillary lymph nodes. Thyroid gland, trachea, and esophagus demonstrate no significant findings. Lungs/Pleura: Interval minimal patchy interstitial prominence at the lung bases, greater on the left. No pleural fluid. Musculoskeletal:  Thoracic spine degenerative changes. Cervical spine fixation hardware. Review of the MIP images confirms the above findings. CTA ABDOMEN AND PELVIS FINDINGS VASCULAR Aorta: Normal, without aneurysm or dissection. Celiac: Normal SMA: Normal Renals: Normal IMA: Normal Inflow: Normal Veins: No obvious venous abnormality within the limitations of this arterial phase study. Review of the MIP images confirms the above findings. NON-VASCULAR Hepatobiliary: Diffuse low density of the liver. Cholecystectomy clips. Pancreas: Unremarkable. No pancreatic ductal dilatation or surrounding inflammatory changes. Spleen: Normal in size without focal abnormality. Adrenals/Urinary Tract: Adrenal glands are unremarkable. Kidneys are normal, without renal calculi, focal lesion, or hydronephrosis. Bladder is unremarkable. Stomach/Bowel: Post gastric sleeve changes. Multiple colonic diverticula. Normal appearing small bowel and appendix. Lymphatic: No enlarged lymph nodes. Reproductive: Status post hysterectomy. No adnexal masses. Other: Small ventral hernia containing fat. Edema or scarring in the fat of the previously demonstrated umbilical  hernia containing fat. The patient had this surgically repaired on 06/29/2017. Musculoskeletal: Lumbar spine degenerative changes. Review of the MIP images confirms the above findings. IMPRESSION: 1. No aortic aneurysm or dissection. 2. No pulmonary emboli. 3. Minimal interstitial edema or atelectasis in the lower lobes, greater on the left. 4. Diffuse hepatic steatosis. 5. Colonic diverticulosis. 6. Small ventral hernia containing fat. 7. Surgical repair of the previously demonstrated umbilical hernia containing fat with interval soft tissue edema or scarring in the fat. Electronically Signed   By: Claudie Revering M.D.   On: 08/26/2017 22:11    Procedures Procedures (including critical care time)  Medications Ordered in ED Medications  aspirin chewable tablet 324 mg (not administered)    morphine 4 MG/ML injection 4 mg (not administered)  gi cocktail (Maalox,Lidocaine,Donnatal) (30 mLs Oral Given 08/26/17 2103)  morphine 4 MG/ML injection 4 mg (4 mg Intravenous Given 08/26/17 2103)  iopamidol (ISOVUE-370) 76 % injection (100 mLs Intravenous Contrast Given 08/26/17 2114)     Initial Impression / Assessment and Plan / ED Course  I have reviewed the triage vital signs and the nursing notes.  Pertinent labs & imaging results that were available during my care of the patient were reviewed by me and considered in my medical decision making (see chart for details).     Presents with sudden onset of severe chest pain radiating to the back, jaw and arm. She is nontoxic in no acute distress but initially appears uncomfortable on exam. Her vitals are stable. EKG shows nonspecific T-wave changes, unchanged from previous EKG without evidence of acute ischemia. Her first troponin is negative. Chest x-ray visualized and shows no acute cardiopulmonary processes. Although her neurological exam is intact she continues to say there is a numbing sensation over the left jaw and left arm. A CTA was performed to rule out dissection, and this is visualized and shows no acute intrathoracic processes. Overall her heart score is 4. We'll admit her for serial enzymes and cardiac rule out. Discussed with Dr. Maudie Mercury.  Final Clinical Impressions(s) / ED Diagnoses   Final diagnoses:  Nonspecific chest pain    New Prescriptions New Prescriptions   No medications on file     Forde Dandy, MD 08/26/17 2252

## 2017-08-27 ENCOUNTER — Ambulatory Visit (HOSPITAL_BASED_OUTPATIENT_CLINIC_OR_DEPARTMENT_OTHER)
Admit: 2017-08-27 | Discharge: 2017-08-27 | Disposition: A | Discharge status: Home / Self Care | Payer: BLUE CROSS/BLUE SHIELD | Attending: Internal Medicine | Admitting: Internal Medicine

## 2017-08-27 ENCOUNTER — Observation Stay (HOSPITAL_COMMUNITY): Payer: BLUE CROSS/BLUE SHIELD

## 2017-08-27 DIAGNOSIS — R0789 Other chest pain: Secondary | ICD-10-CM | POA: Diagnosis not present

## 2017-08-27 DIAGNOSIS — R079 Chest pain, unspecified: Secondary | ICD-10-CM | POA: Diagnosis not present

## 2017-08-27 DIAGNOSIS — R0782 Intercostal pain: Secondary | ICD-10-CM | POA: Diagnosis not present

## 2017-08-27 LAB — NM MYOCAR MULTI W/SPECT W/WALL MOTION / EF
CHL CUP MPHR: 170 {beats}/min
CHL CUP RESTING HR STRESS: 61 {beats}/min
CHL RATE OF PERCEIVED EXERTION: 0
CSEPEDS: 0 s
Estimated workload: 1 METS
Exercise duration (min): 0 min
Peak HR: 100 {beats}/min
Percent HR: 58 %

## 2017-08-27 LAB — TROPONIN I: Troponin I: <0.03 ng/mL (ref <0.03)

## 2017-08-27 LAB — HIV ANTIBODY (ROUTINE TESTING W REFLEX): HIV Screen 4th Generation wRfx: NONREACTIVE

## 2017-08-27 LAB — MRSA PCR SCREENING: MRSA BY PCR: NEGATIVE

## 2017-08-27 MED ORDER — ACETAMINOPHEN 325 MG PO TABS: 650.0000 mg | ORAL_TABLET | ORAL | Status: DC | PRN

## 2017-08-27 MED ORDER — ENOXAPARIN SODIUM 40 MG/0.4ML ~~LOC~~ SOLN
40.0000 mg | Freq: Every day | SUBCUTANEOUS | Status: DC
  Administered 2017-08-27: 40 mg via SUBCUTANEOUS
  Filled 2017-08-27: qty 0.4

## 2017-08-27 MED ORDER — SERTRALINE HCL 100 MG PO TABS
200.0000 mg | ORAL_TABLET | Freq: Every day | ORAL | Status: DC
  Administered 2017-08-27: 200 mg via ORAL
  Filled 2017-08-27: qty 2

## 2017-08-27 MED ORDER — TRAZODONE HCL 50 MG PO TABS
100.0000 mg | ORAL_TABLET | Freq: Every day | ORAL | Status: DC
  Administered 2017-08-27: 100 mg via ORAL
  Filled 2017-08-27: qty 2

## 2017-08-27 MED ORDER — ONDANSETRON HCL 4 MG/2ML IJ SOLN
4.0000 mg | Freq: Four times a day (QID) | INTRAMUSCULAR | Status: DC | PRN
  Administered 2017-08-27 (×2): 4 mg via INTRAVENOUS
  Filled 2017-08-27 (×2): qty 2

## 2017-08-27 MED ORDER — FENTANYL CITRATE (PF) 100 MCG/2ML IJ SOLN
25.0000 ug | INTRAMUSCULAR | Status: DC | PRN
  Administered 2017-08-27: 25 ug via INTRAVENOUS
  Filled 2017-08-27: qty 2

## 2017-08-27 MED ORDER — TECHNETIUM TC 99M TETROFOSMIN IV KIT
30.0000 | PACK | Freq: Once | INTRAVENOUS | Status: AC | PRN
  Administered 2017-08-27: 30 via INTRAVENOUS

## 2017-08-27 MED ORDER — REGADENOSON 0.4 MG/5ML IV SOLN
0.4000 mg | Freq: Once | INTRAVENOUS | Status: AC
  Administered 2017-08-27: 0.4 mg via INTRAVENOUS

## 2017-08-27 MED ORDER — PANTOPRAZOLE SODIUM 40 MG PO TBEC
40.0000 mg | DELAYED_RELEASE_TABLET | Freq: Every day | ORAL | Status: DC
  Administered 2017-08-27: 40 mg via ORAL
  Filled 2017-08-27: qty 1

## 2017-08-27 MED ORDER — TECHNETIUM TC 99M TETROFOSMIN IV KIT
10.0000 | PACK | Freq: Once | INTRAVENOUS | Status: AC | PRN
  Administered 2017-08-27: 10 via INTRAVENOUS

## 2017-08-27 MED ORDER — ALPRAZOLAM 0.5 MG PO TABS
0.5000 mg | ORAL_TABLET | Freq: Three times a day (TID) | ORAL | Status: DC
  Administered 2017-08-27: 0.5 mg via ORAL
  Filled 2017-08-27: qty 1

## 2017-08-27 MED ORDER — METHOCARBAMOL 500 MG PO TABS: 1000.0000 mg | ORAL_TABLET | Freq: Four times a day (QID) | ORAL | 0 refills | Status: DC | PRN

## 2017-08-27 MED ORDER — REGADENOSON 0.4 MG/5ML IV SOLN
INTRAVENOUS | Status: AC
  Administered 2017-08-27: 0.4 mg via INTRAVENOUS
  Filled 2017-08-27: qty 5

## 2017-08-27 MED ORDER — FUROSEMIDE 20 MG PO TABS: 20.0000 mg | ORAL_TABLET | Freq: Every day | ORAL | Status: DC | PRN

## 2017-08-27 NOTE — Progress Notes (Signed)
Pt has neg stress test.  I asked ICU nurse to let her know.  DR. Oval Linsey felt like it would be negative.

## 2017-08-27 NOTE — Progress Notes (Signed)
Patient discharged with husband. Patient education completed and patient verbalized understanding. IV removed and documented.

## 2017-08-27 NOTE — Progress Notes (Signed)
lexiscan portion completed without complication Nuc results to follow

## 2017-08-27 NOTE — Consult Note (Signed)
Cardiology Consultation:   Patient ID: DEA BITTING; [id number]; 1966/07/15   Admit date: 08/26/2017 Date of Consult: 08/27/2017  Primary Care Provider: Lawerance Cruel, MD Primary Cardiologist: New    Patient Profile:   [name] is a 51 y.o. female with a hx of obesity who is being seen today for the evaluation of chest pain at the request of Dr Maudie Mercury.  History of Present Illness:   [name] is a 51 y/o female with morbid obesity and sleep apnea (not on C-pap). She had a gastric sleeve placed 06/29/17. She has no history of CAD or chest pain. In general she is not very active. 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 She has had prior neck surgery (Dr Ellene Route). She uses a wheelchair when shopping.   Yesterday PM she was sitting on the commode. She had nausea and vomiting which she says has not been unusual post gastric sleeve. After vomiting she developed pain across her chest, Rt should to left shoulder, followed by Lt facial and Lt arm numbness. The actual chest pain "burning" lasted less than a minute. She had one Troponin drawn in the ED last night and the attending ordered a Lexiscan Myoview to be done this am.   Past Medical History:  Diagnosis Date  . Anxiety   . Cancer ([place]) 1990   Cervical  . Carpal tunnel syndrome, bilateral   . Complication of anesthesia INTRAOP HYPERTENSION W/ TONSILLECTOMY SURGERY AGE 52--  NO ISSUES SINCE   Pt stated that she stopped breathing due. Was later diagnoses with Sleep Apnea, Didnot obtain a CPAP machince due to cost  . Left knee pain   . Sleep apnea     Past Surgical History:  Procedure Laterality Date  . ABDOMINAL HYSTERECTOMY     May 15, 2016  . ANTERIOR CERVICAL DECOMP/DISCECTOMY FUSION  10-16-2010   C4  -  C6  . CERVICAL BIOPSY  W/ LOOP ELECTRODE EXCISION  2010  . CESAREAN SECTION  1987  . CHOLECYSTECTOMY  2009  . FOOT SURGERY  2010   LEFT  . KNEE ARTHROSCOPY  10/20/2012   Procedure: ARTHROSCOPY KNEE;  Surgeon: Mauri Pole, MD;  [facility];  Service: Orthopedics;  Laterality: Left;  medial menisectomy medial patellar condroplasty and sinovectomy  . LAPAROSCOPIC GASTRIC SLEEVE RESECTION N/A 06/29/2017   Procedure: LAPAROSCOPIC GASTRIC SLEEVE RESECTION;  Surgeon: Johnathan Hausen, MD;  Location: [facility];  Service: General;  Laterality: N/A;  . TONSILLECTOMY  AGE 52  . TRANSTHORACIC ECHOCARDIOGRAM  06-20-2010   NORMAL LVSF/ EF 55%  . TUBAL LIGATION  1988  . UMBILICAL HERNIA REPAIR  06/29/2017   Procedure: HERNIA REPAIR UMBILICAL ADULT;  Surgeon: Johnathan Hausen, MD;  Location: [facility];  Service: General;;     Home Medications:  Prior to Admission medications   Medication Sig Start Date End Date Taking? Authorizing Provider  ALPRAZolam Duanne Moron) 0.5 MG tablet Take 0.5 mg by mouth 3 (three) times daily.   Yes [provider]  B Complex-Iron-Minerals (GERIATRIC VITAMIN PO) Take 1 tablet by mouth daily.   Yes [provider]  calcium carbonate (CALTRATE 600) 1500 (600 Ca) MG TABS tablet Take 1 tablet by mouth 3 (three) times daily with meals.   Yes [provider]  furosemide (LASIX) 20 MG tablet Take 20 mg by mouth daily as needed for fluid or edema.   Yes [provider]  nystatin cream (MYCOSTATIN) APPLY TO AFFECTED AREA AS NEEDED DAILY  AS NEEDED FOR RASH 07/21/17  Yes [provider]  pantoprazole (PROTONIX) 40 MG tablet Take 40 mg by mouth daily. 07/25/17  Yes [provider]  sertraline (ZOLOFT) 100 MG tablet Take 200 mg by mouth at bedtime.    Yes [provider]  traZODone (DESYREL) 50 MG tablet Take 100 mg by mouth at bedtime.    Yes [provider]  acetaminophen (TYLENOL) 500 MG tablet Take 1,000 mg by mouth every 8 (eight) hours as needed for mild pain or headache.    [provider]  HYDROcodone-acetaminophen (NORCO/VICODIN) 5-325 MG tablet Take 1 tablet by mouth every 4 (four) hours as needed for moderate  pain. Patient not taking: Reported on 06/22/2017 12/11/15   Julianne Rice, MD  megestrol (MEGACE) 40 MG tablet Take 1 tablet (40 mg total) by mouth 2 (two) times daily. 80mg  bid for 1 month then 32mb bid Patient not taking: Reported on 06/22/2017 08/21/15   Emily Filbert, MD  methocarbamol (ROBAXIN) 500 MG tablet Take 2 tablets (1,000 mg total) by mouth every 6 (six) hours as needed for muscle spasms. Patient not taking: Reported on 06/22/2017 12/11/15   Julianne Rice, MD    Inpatient Medications: Scheduled Meds: . ALPRAZolam  0.5 mg Oral TID  . enoxaparin (LOVENOX) injection  40 mg Subcutaneous QHS  . pantoprazole  40 mg Oral Daily  . sertraline  200 mg Oral QHS  . traZODone  100 mg Oral QHS   Continuous Infusions:  PRN Meds: acetaminophen, furosemide, ondansetron (ZOFRAN) IV  Allergies:    Allergies  Allergen Reactions  . Cortisone Hives and Nausea And Vomiting  . Naproxen Sodium Hives    aleve  . Prednisone Hives    Social History:   Social History   Social History  . Marital status: Married    Spouse name: N/A  . Number of children: N/A  . Years of education: N/A   Occupational History  . Not on file.   Social History Main Topics  . Smoking status: Never Smoker  . Smokeless tobacco: Never Used  . Alcohol use No  . Drug use: No  . Sexual activity: Yes    Birth control/ protection: Surgical   Other Topics Concern  . Not on file   Social History Narrative  . No narrative on file    Family History:    Family History  Problem Relation Age of Onset  . Adopted: Yes  . Cancer Mother   . Diabetes Mother   . Cancer Paternal Grandmother      ROS:  Please see the history of present illness.  ROS  All other ROS reviewed and negative.     Physical Exam/Data:   Vitals:   08/27/17 0352 08/27/17 0400 08/27/17 0500 08/27/17 0700  BP:  (!) 85/56 91/63   Pulse:  (!) 58 (!) 51 (!) 53  Resp:  10 10 11   Temp: (!) 97.5 F (36.4 C)     TempSrc: Oral     SpO2:   95% 97% 90%  Weight:      Height:       No intake or output data in the 24 hours ending 08/27/17 0820 Filed Weights   08/26/17 1935  Weight: 213 lb (96.6 kg)   Body mass index is 41.6 kg/m.  General:  Obese, NAD HEENT: normal Lymph: no adenopathy Neck: no JVD Endocrine:  No thryomegaly Vascular: No carotid bruits; FA pulses 2+ bilaterally without bruits  Cardiac:  normal S1, S2;  RRR; no murmur  Lungs:  clear to auscultation bilaterally, no wheezing, rhonchi or rales  Abd: obesity, soft, nontender, no hepatomegaly  Ext: no edema Musculoskeletal:  No deformities, BUE and BLE strength normal and equal Skin: warm and dry  Neuro:  CNs 2-12 intact, no focal abnormalities noted Psych:  Normal affect   EKG:  The EKG was personally reviewed and demonstrates:  NSR with TWI V2- same as June 2018 Telemetry:  Telemetry was personally reviewed and demonstrates:  NSR, PVCs  Relevant CV Studies:   Laboratory Data:  Chemistry Recent Labs Lab 08/26/17 1951  NA 142  K 3.6  CL 106  CO2 25  GLUCOSE 95  BUN 16  CREATININE 0.62  CALCIUM 9.3  GFRNONAA >60  GFRAA >60  ANIONGAP 11    No results for input(s): PROT, ALBUMIN, AST, ALT, ALKPHOS, BILITOT in the last 168 hours. Hematology Recent Labs Lab 08/26/17 1951  WBC 6.6  RBC 4.20  HGB 12.8  HCT 38.1  MCV 90.7  MCH 30.5  MCHC 33.6  RDW 13.8  PLT 226   Cardiac EnzymesNo results for input(s): TROPONINI in the last 168 hours.  Recent Labs Lab 08/26/17 2002  TROPIPOC 0.00    BNPNo results for input(s): BNP, PROBNP in the last 168 hours.  DDimer No results for input(s): DDIMER in the last 168 hours.  Radiology/Studies:  Dg Chest 2 View  Result Date: 08/26/2017 CLINICAL DATA:  51 year old female with chest pain. Multiple episodes of emesis. EXAM: CHEST  2 VIEW COMPARISON:  Chest radiograph dated 05/02/2017 FINDINGS: The lungs are clear. There is no pleural effusion or pneumothorax. Stable top-normal cardiac silhouette.  Cervical fusion plate and screws noted. Degenerative changes of the spine. No acute osseous pathology. IMPRESSION: No active cardiopulmonary disease. Electronically Signed   By: Anner Crete M.D.   On: 08/26/2017 20:22   Ct Angio Chest/abd/pel For Dissection W And/or Wo Contrast  Result Date: 08/26/2017 CLINICAL DATA:  Chest pain. 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 Episodes of nausea and vomiting today. Clinical concern for aortic dissection. EXAM: CT ANGIOGRAPHY CHEST, ABDOMEN AND PELVIS TECHNIQUE: Multidetector CT imaging through the chest, abdomen and pelvis was performed using the standard protocol during bolus administration of intravenous contrast. Multiplanar reconstructed images and MIPs were obtained and reviewed to evaluate the vascular anatomy. CONTRAST:  100 cc Isovue 370 COMPARISON:  Chest radiographs obtained today. Chest CTA dated 12/12/2015. Abdomen and pelvis CT dated 06/25/2015. FINDINGS: CTA CHEST FINDINGS Cardiovascular: Normal appearing thoracic aorta without aneurysm or dissection. Normally opacified pulmonary arteries with no pulmonary arterial filling defects. Mildly enlarged heart. No pericardial effusion. Mediastinum/Nodes: No enlarged mediastinal, hilar, or axillary lymph nodes. Thyroid gland, trachea, and esophagus demonstrate no significant findings. Lungs/Pleura: Interval minimal patchy interstitial prominence at the lung bases, greater on the left. No pleural fluid. Musculoskeletal: Thoracic spine degenerative changes. Cervical spine fixation hardware. Review of the MIP images confirms the above findings. CTA ABDOMEN AND PELVIS FINDINGS VASCULAR Aorta: Normal, without aneurysm or dissection. Celiac: Normal SMA: Normal Renals: Normal IMA: Normal Inflow: Normal Veins: No obvious venous abnormality within the limitations of this arterial phase study. Review of the MIP images confirms the above findings. NON-VASCULAR Hepatobiliary: Diffuse low density of the  liver. Cholecystectomy clips. Pancreas: Unremarkable. No pancreatic ductal dilatation or surrounding inflammatory changes. Spleen: Normal in size without focal abnormality. Adrenals/Urinary Tract: Adrenal glands are unremarkable. Kidneys are normal, without renal calculi, focal lesion, or hydronephrosis. Bladder is unremarkable. Stomach/Bowel: Post gastric sleeve changes. Multiple colonic  diverticula. Normal appearing small bowel and appendix. Lymphatic: No enlarged lymph nodes. Reproductive: Status post hysterectomy. No adnexal masses. Other: Small ventral hernia containing fat. Edema or scarring in the fat of the previously demonstrated umbilical hernia containing fat. The patient had this surgically repaired on 06/29/2017. Musculoskeletal: Lumbar spine degenerative changes. Review of the MIP images confirms the above findings. IMPRESSION: 1. No aortic aneurysm or dissection. 2. No pulmonary emboli. 3. Minimal interstitial edema or atelectasis in the lower lobes, greater on the left. 4. Diffuse hepatic steatosis. 5. Colonic diverticulosis. 6. Small ventral hernia containing fat. 7. Surgical repair of the previously demonstrated umbilical hernia containing fat with interval soft tissue edema or scarring in the fat. Electronically Signed   By: Claudie Revering M.D.   On: 08/26/2017 22:11    Assessment and Plan:   Chest pain- MD to review- ? Secondary to C-spine DJD, pinched nerve  Obesity-  BMI 41  Sleep apnea- Not on C-pap  Plan- Check Troponin this am. Myoview has already been ordered by PCP and [facility] on the way.    For questions or updates, please contact [facility] Please consult [email or web address] for contact info under Cardiology/STEMI.   Angelena Form, PA-C  08/27/2017 8:20 AM

## 2017-08-27 NOTE — Care Management Note (Signed)
Case Management Note  Patient Details  Name: [name] MRN: [id number] Date of Birth: 1966/07/20  Subjective/Objective:                  Chest pain   Action/Plan: Date:  August 27, 2017 Chart reviewed for concurrent status and case management needs.  Will continue to follow patient progress.  Discharge Planning: following for needs  Expected discharge date: [id number]  Velva Harman, BSN, [place], [place]   Expected Discharge Date:                  Expected Discharge Plan:  Home/Self Care  In-House Referral:     Discharge planning Services  CM Consult  Post Acute Care Choice:    Choice offered to:     DME Arranged:    DME Agency:     HH Arranged:    HH Agency:     Status of Service:  In process, will continue to follow  If discussed at Long Length of Stay Meetings, dates discussed:    Additional Comments:  Leeroy Cha, RN 08/27/2017, 8:33 AM

## 2017-08-27 NOTE — Progress Notes (Signed)
Pre lexi scan

## 2017-08-27 NOTE — Discharge Summary (Signed)
Physician Discharge Summary  [name] MRN: [id number] DOB/AGE: Mar 13, 1966 51 y.o.  PCP: Lawerance Cruel, MD   Admit date: 08/26/2017 Discharge date: 08/27/2017  Discharge Diagnoses:    Active Problems:   Chest pain   Nausea Anxiety disorder Morbid obesity    Follow-up recommendations Follow-up with PCP in 3-5 days , including all  additional recommended appointments as below Follow-up CBC, CMP in 3-5 days  Results of 2-D echo pending at the time of discharge, basically requested to follow-up on the results     Current Discharge Medication List    CONTINUE these medications which have NOT CHANGED   Details  ALPRAZolam (XANAX) 0.5 MG tablet Take 0.5 mg by mouth 3 (three) times daily.    B Complex-Iron-Minerals (GERIATRIC VITAMIN PO) Take 1 tablet by mouth daily.    calcium carbonate (CALTRATE 600) 1500 (600 Ca) MG TABS tablet Take 1 tablet by mouth 3 (three) times daily with meals.    furosemide (LASIX) 20 MG tablet Take 20 mg by mouth daily as needed for fluid or edema.    nystatin cream (MYCOSTATIN) APPLY TO AFFECTED AREA AS NEEDED DAILY AS NEEDED FOR RASH Refills: 1    pantoprazole (PROTONIX) 40 MG tablet Take 40 mg by mouth daily. Refills: 2    sertraline (ZOLOFT) 100 MG tablet Take 200 mg by mouth at bedtime.     traZODone (DESYREL) 50 MG tablet Take 100 mg by mouth at bedtime.     acetaminophen (TYLENOL) 500 MG tablet Take 1,000 mg by mouth every 8 (eight) hours as needed for mild pain or headache.    HYDROcodone-acetaminophen (NORCO/VICODIN) 5-325 MG tablet Take 1 tablet by mouth every 4 (four) hours as needed for moderate pain. Qty: 10 tablet, Refills: 0    megestrol (MEGACE) 40 MG tablet Take 1 tablet (40 mg total) by mouth 2 (two) times daily. 54m bid for 1 month then 444mbid Qty: 120 tablet, Refills: 6    methocarbamol (ROBAXIN) 500 MG tablet Take 2 tablets (1,000 mg total) by mouth every 6 (six) hours as needed for muscle spasms. Qty: 30  tablet, Refills: 0         Discharge Condition: Stable   Discharge Instructions Get Medicines reviewed and adjusted: Please take all your medications with you for your next visit with your Primary MD  Please request your Primary MD to go over all hospital tests and procedure/radiological results at the follow up, please ask your Primary MD to get all Hospital records sent to his/her office.  If you experience worsening of your admission symptoms, develop shortness of breath, life threatening emergency, suicidal or homicidal thoughts you must seek medical attention immediately by calling 911 or calling your MD immediately if symptoms less severe.  You must read complete instructions/literature along with all the possible adverse reactions/side effects for all the Medicines you take and that have been prescribed to you. Take any new Medicines after you have completely understood and accpet all the possible adverse reactions/side effects.   Do not drive when taking Pain medications.   Do not take more than prescribed Pain, Sleep and Anxiety Medications  Special Instructions: If you have smoked or chewed Tobacco in the last 2 yrs please stop smoking, stop any regular Alcohol and or any Recreational drug use.  Wear Seat belts while driving.  Please note  You were cared for by a hospitalist during your hospital stay. Once you are discharged, your primary care physician will handle any further medical  issues. Please note that NO REFILLS for any discharge medications will be authorized once you are discharged, as it is imperative that you return to your primary care physician (or establish a relationship with a primary care physician if you do not have one) for your aftercare needs so that they can reassess your need for medications and monitor your lab values.     Allergies  Allergen Reactions  . Cortisone Hives and Nausea And Vomiting  . Naproxen Sodium Hives    aleve  . Prednisone  Hives      Disposition: 01-Home or Self Care   Consults: * Cardiology    Significant Diagnostic Studies:  Dg Chest 2 View  Result Date: 08/26/2017 CLINICAL DATA:  51 year old female with chest pain. Multiple episodes of emesis. EXAM: CHEST  2 VIEW COMPARISON:  Chest radiograph dated 05/02/2017 FINDINGS: The lungs are clear. There is no pleural effusion or pneumothorax. Stable top-normal cardiac silhouette. Cervical fusion plate and screws noted. Degenerative changes of the spine. No acute osseous pathology. IMPRESSION: No active cardiopulmonary disease. Electronically Signed   By: Anner Crete M.D.   On: 08/26/2017 20:22   Ct Angio Chest/abd/pel For Dissection W And/or Wo Contrast  Result Date: 08/26/2017 CLINICAL DATA:  Chest pain. 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 Episodes of nausea and vomiting today. Clinical concern for aortic dissection. EXAM: CT ANGIOGRAPHY CHEST, ABDOMEN AND PELVIS TECHNIQUE: Multidetector CT imaging through the chest, abdomen and pelvis was performed using the standard protocol during bolus administration of intravenous contrast. Multiplanar reconstructed images and MIPs were obtained and reviewed to evaluate the vascular anatomy. CONTRAST:  100 cc Isovue 370 COMPARISON:  Chest radiographs obtained today. Chest CTA dated 12/12/2015. Abdomen and pelvis CT dated 06/25/2015. FINDINGS: CTA CHEST FINDINGS Cardiovascular: Normal appearing thoracic aorta without aneurysm or dissection. Normally opacified pulmonary arteries with no pulmonary arterial filling defects. Mildly enlarged heart. No pericardial effusion. Mediastinum/Nodes: No enlarged mediastinal, hilar, or axillary lymph nodes. Thyroid gland, trachea, and esophagus demonstrate no significant findings. Lungs/Pleura: Interval minimal patchy interstitial prominence at the lung bases, greater on the left. No pleural fluid. Musculoskeletal: Thoracic spine degenerative changes. Cervical spine  fixation hardware. Review of the MIP images confirms the above findings. CTA ABDOMEN AND PELVIS FINDINGS VASCULAR Aorta: Normal, without aneurysm or dissection. Celiac: Normal SMA: Normal Renals: Normal IMA: Normal Inflow: Normal Veins: No obvious venous abnormality within the limitations of this arterial phase study. Review of the MIP images confirms the above findings. NON-VASCULAR Hepatobiliary: Diffuse low density of the liver. Cholecystectomy clips. Pancreas: Unremarkable. No pancreatic ductal dilatation or surrounding inflammatory changes. Spleen: Normal in size without focal abnormality. Adrenals/Urinary Tract: Adrenal glands are unremarkable. Kidneys are normal, without renal calculi, focal lesion, or hydronephrosis. Bladder is unremarkable. Stomach/Bowel: Post gastric sleeve changes. Multiple colonic diverticula. Normal appearing small bowel and appendix. Lymphatic: No enlarged lymph nodes. Reproductive: Status post hysterectomy. No adnexal masses. Other: Small ventral hernia containing fat. Edema or scarring in the fat of the previously demonstrated umbilical hernia containing fat. The patient had this surgically repaired on 06/29/2017. Musculoskeletal: Lumbar spine degenerative changes. Review of the MIP images confirms the above findings. IMPRESSION: 1. No aortic aneurysm or dissection. 2. No pulmonary emboli. 3. Minimal interstitial edema or atelectasis in the lower lobes, greater on the left. 4. Diffuse hepatic steatosis. 5. Colonic diverticulosis. 6. Small ventral hernia containing fat. 7. Surgical repair of the previously demonstrated umbilical hernia containing fat with interval soft tissue edema or scarring in the fat. Electronically Signed  By: Claudie Revering M.D.   On: 08/26/2017 22:11     Echocardiogram-pending    Filed Weights   08/26/17 1935  Weight: 96.6 kg (213 lb)     Microbiology: Recent Results (from the past 240 hour(s))  MRSA PCR Screening     Status: None   Collection  Time: 08/27/17  2:51 AM  Result Value Ref Range Status   MRSA by PCR NEGATIVE NEGATIVE Final    Comment:        The GeneXpert MRSA Assay (FDA approved for NASAL specimens only), is one component of a comprehensive MRSA colonization surveillance program. It is not intended to diagnose MRSA infection nor to guide or monitor treatment for MRSA infections.        Blood Culture    Component Value Date/Time   SDES URINE, RANDOM 09/29/2009 1216   SPECREQUEST NONE 09/29/2009 1216   CULT NO GROWTH 09/29/2009 1216   REPTSTATUS 10/01/2009 FINAL 09/29/2009 1216      Labs: Results for orders placed or performed during the hospital encounter of 08/26/17 (from the past 48 hour(s))  Basic metabolic panel     Status: None   Collection Time: 08/26/17  7:51 PM  Result Value Ref Range   Sodium 142 135 - 145 mmol/L   Potassium 3.6 3.5 - 5.1 mmol/L   Chloride 106 101 - 111 mmol/L   CO2 25 22 - 32 mmol/L   Glucose, Bld 95 65 - 99 mg/dL   BUN 16 6 - 20 mg/dL   Creatinine, Ser 0.62 0.44 - 1.00 mg/dL   Calcium 9.3 8.9 - 10.3 mg/dL   GFR calc non Af Amer >60 >60 mL/min   GFR calc Af Amer >60 >60 mL/min    Comment: (NOTE) The eGFR has been calculated using the CKD EPI equation. This calculation has not been validated in all clinical situations. eGFR's persistently <60 mL/min signify possible Chronic Kidney Disease.    Anion gap 11 5 - 15  CBC     Status: None   Collection Time: 08/26/17  7:51 PM  Result Value Ref Range   WBC 6.6 4.0 - 10.5 K/uL   RBC 4.20 3.87 - 5.11 MIL/uL   Hemoglobin 12.8 12.0 - 15.0 g/dL   HCT 38.1 36.0 - 46.0 %   MCV 90.7 78.0 - 100.0 fL   MCH 30.5 26.0 - 34.0 pg   MCHC 33.6 30.0 - 36.0 g/dL   RDW 13.8 11.5 - 15.5 %   Platelets 226 150 - 400 K/uL  POCT i-Stat troponin I     Status: None   Collection Time: 08/26/17  8:02 PM  Result Value Ref Range   Troponin i, poc 0.00 0.00 - 0.08 ng/mL   Comment 3            Comment: Due to the release kinetics of  cTnI, a negative result within the first hours of the onset of symptoms does not rule out myocardial infarction with certainty. If myocardial infarction is still suspected, repeat the test at appropriate intervals.   MRSA PCR Screening     Status: None   Collection Time: 08/27/17  2:51 AM  Result Value Ref Range   MRSA by PCR NEGATIVE NEGATIVE    Comment:        The GeneXpert MRSA Assay (FDA approved for NASAL specimens only), is one component of a comprehensive MRSA colonization surveillance program. It is not intended to diagnose MRSA infection nor to guide or monitor treatment for MRSA  infections.      Lipid Panel     Component Value Date/Time   CHOL  06/20/2010 0151    183        ATP III CLASSIFICATION:  <200     mg/dL   Desirable  200-239  mg/dL   Borderline High  >=240    mg/dL   High          TRIG 160 (H) 06/20/2010 0151   HDL 35 (L) 06/20/2010 0151   CHOLHDL 5.2 06/20/2010 0151   VLDL 32 06/20/2010 0151   LDLCALC (H) 06/20/2010 0151    116        Total Cholesterol/HDL:CHD Risk Coronary Heart Disease Risk Table                     Men   Women  1/2 Average Risk   3.4   3.3  Average Risk       5.0   4.4  2 X Average Risk   9.6   7.1  3 X Average Risk  23.4   11.0        Use the calculated Patient Ratio above and the CHD Risk Table to determine the patient's CHD Risk.        ATP III CLASSIFICATION (LDL):  <100     mg/dL   Optimal  100-129  mg/dL   Near or Above                    Optimal  130-159  mg/dL   Borderline  160-189  mg/dL   High  >190     mg/dL   Very High     No results found for: HGBA1C   Lab Results  Component Value Date   LDLCALC (H) 06/20/2010    116        Total Cholesterol/HDL:CHD Risk Coronary Heart Disease Risk Table                     Men   Women  1/2 Average Risk   3.4   3.3  Average Risk       5.0   4.4  2 X Average Risk   9.6   7.1  3 X Average Risk  23.4   11.0        Use the calculated Patient Ratio above and  the CHD Risk Table to determine the patient's CHD Risk.        ATP III CLASSIFICATION (LDL):  <100     mg/dL   Optimal  100-129  mg/dL   Near or Above                    Optimal  130-159  mg/dL   Borderline  160-189  mg/dL   High  >190     mg/dL   Very High   CREATININE 0.62 08/26/2017     HPI   51 year old female with a history of morbid obesity,She has a history of obesity status post sleeve gastrectomy and cholecystectomy. States that she retrieved we will have nausea, vomiting and diarrhea associated with her sleeve gastrectomy. Today did have episode of nausea and vomiting; however, she subsequently developed severe sharp chest pains that radiated across her chest and towards the back. She felt very lightheaded, but did not have syncope. States that she had burning and numbing pain subsequently that radiated the left jaw and then the left arm.she denies any dyspnea. Has  not had similar symptoms before in the past. No aggravating or alleviating factors. Her vitals are stable. EKG shows nonspecific T-wave changes, unchanged from previous EKG without evidence of acute ischemia. Her first troponin is negative. Chest x-ray visualized and shows no acute cardiopulmonary processes. Although her neurological exam is intact she continues to say there is a numbing sensation over the left jaw and left arm. A CTA was performed to rule out dissection, and this is visualized and shows no acute intrathoracic processes. Overall her heart score is 4. . Patient admitted for chest pain rule out and further evaluation   HOSPITAL COURSE:   Chest pain-atypical Patient is morbidly obese with a history of gastric sleeve, OSA, not on Z-Pak Chest pain appears to be atypical, sharp with radiation into the left arm and neck, EKG unremarkable, cardiac enzymes negative 2 Patient evaluated by cardiology,with her atypical symptoms and lack of symptoms with exercise along with a negative cardiac workup thus far, stress  test is not truly needed.  However she would "rather be safe than sorry."   cardiology discussed  the possibility of further downstream testing related to false positives.  She still wishes to continue with stress testing as already ordered by the hospitalist service.   Nuclear stress test result came back-no reversible ischemia or infarction, normal LV function, ejection fraction 64%  Sleep apnea-not on Z-Pak  Morbid obesity, BMI 41    Discharge Exam:   Blood pressure 91/63, pulse (!) 53, temperature (!) 97.5 F (36.4 C), temperature source Oral, resp. rate 11, height 5' (1.524 m), weight 96.6 kg (213 lb), last menstrual period 06/21/2015, SpO2 92 %.  Cardiac:  normal S1, S2; RRR; no murmur  Lungs:  clear to auscultation bilaterally, no wheezing, rhonchi or rales  Abd: obesity, soft, nontender, no hepatomegaly  Ext: no edema Musculoskeletal:  No deformities, BUE and BLE strength normal and equal Skin: warm and dry  Neuro:  CNs 2-12 intact, no focal abnormalities noted Psych:  Normal affect     Follow-up Information    Lawerance Cruel, MD. Call.   Specialty:  Family Medicine Why:  Hospital follow-up in 3-5 days Contact information: [place] [address] [phone]           Signed: Reyne Dumas 08/27/2017, 8:52 AM        Time spent >1 hour

## 2017-08-31 ENCOUNTER — Other Ambulatory Visit: Payer: Self-pay | Admitting: Family Medicine

## 2017-08-31 DIAGNOSIS — Z1231 Encounter for screening mammogram for malignant neoplasm of breast: Secondary | ICD-10-CM

## 2017-09-27 ENCOUNTER — Ambulatory Visit
Admission: RE | Admit: 2017-09-27 | Discharge: 2017-09-27 | Disposition: A | Discharge status: Home / Self Care | Payer: BLUE CROSS/BLUE SHIELD | Source: Ambulatory Visit | Attending: Family Medicine | Admitting: Family Medicine

## 2017-09-27 DIAGNOSIS — Z1231 Encounter for screening mammogram for malignant neoplasm of breast: Secondary | ICD-10-CM

## 2017-10-28 ENCOUNTER — Encounter: Payer: BLUE CROSS/BLUE SHIELD | Attending: Surgery | Admitting: Skilled Nursing Facility1

## 2017-10-28 ENCOUNTER — Encounter: Payer: Self-pay | Admitting: Skilled Nursing Facility1

## 2017-10-28 DIAGNOSIS — Z713 Dietary counseling and surveillance: Secondary | ICD-10-CM | POA: Diagnosis present

## 2017-10-28 DIAGNOSIS — E669 Obesity, unspecified: Secondary | ICD-10-CM

## 2017-10-28 NOTE — Patient Instructions (Addendum)
-  Eat every 3 hours every day  -Aim to have a minimum of 64 fluid ounces every day
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
## 2017-12-29 ENCOUNTER — Ambulatory Visit: Payer: Self-pay | Admitting: Skilled Nursing Facility1

## 2018-01-04 ENCOUNTER — Ambulatory Visit: Payer: Self-pay | Admitting: Skilled Nursing Facility1

## 2018-01-12 ENCOUNTER — Encounter: Payer: Self-pay | Admitting: Skilled Nursing Facility1

## 2018-01-12 ENCOUNTER — Encounter: Payer: BLUE CROSS/BLUE SHIELD | Attending: Surgery | Admitting: Skilled Nursing Facility1

## 2018-01-12 DIAGNOSIS — Z713 Dietary counseling and surveillance: Secondary | ICD-10-CM | POA: Insufficient documentation

## 2018-01-12 DIAGNOSIS — E669 Obesity, unspecified: Secondary | ICD-10-CM

## 2018-01-12 NOTE — Progress Notes (Signed)
Post-Operative Sleeve Surgery  Primary concerns today: Post-operative Bariatric Surgery Nutrition Management.  Pt arrives moving slowly and feeling sad and anxious. Pt states she has had diarrhea/vomiting since yesterday due to the nerves of losing a family member. Pt states she has not eaten or drunken anything except for some protein in her coffee.    Surgery date: 06/29/2017 Surgery type: Sleeve gastrectomy Start weight at [facility]: 245.9 lbs Weight today:  197.2 Weight change: 8.6 lbs loss  TANITA  BODY COMP RESULTS  07/13/2017 08/25/2017 10/28/2017 01/12/2018   BMI (kg/m^2) 45.4 42.5 40.2 38.5   Fat Mass (lbs) 118.0 102.8 96 92   Fat Free Mass (lbs) 110.4 114.6 109.8 105.2   Total Body Water (lbs) 80.4 83 79 75.4    24-hr recall: just coffee B (AM):  Snk (AM):  L (PM):  Snk (PM):   D (PM):  Snk (PM):   Fluid intake: Estimated total protein intake: 60+  Medications: stopped her blood pressure medicine  Supplementation: bariatric advantage and 3 calcium   Using straws: no Drinking while eating: no Having you been chewing well: yes Chewing/swallowing difficulties: no Changes in vision: no Changes to mood/headaches: no Hair loss/Cahnges to skin/Changes to nails: no Any difficulty focusing or concentrating: no Sweating: no Dizziness/Lightheaded: no Palpitations: no  Carbonated beverages: no N/V/D/C/GAS: diarrhea and vomiting  Abdominal Pain: no Dumping syndrome: no  Recent physical activity:  BELT  Progress Towards Goal(s):  In progress.  Handouts given during visit include:  NS veggies + protein    Nutritional Diagnosis:  [place]-3.3 Overweight/obesity related to past poor dietary habits and physical inactivity as evidenced by patient w/ recent sleeve surgery following dietary guidelines for continued weight loss.    Intervention:  Nutrition counseling. Dietitian educated the pt on advancing her diet to include non-starchy veggies + protein. Goals: -Add protein  shakes to your coffee add a tsp of sugar to your coffee  -Try the chicken soup flavor unjury powder  -Call your doctor tomorrow if you are still having vomiting and diarrhea  -Keep liquid in your hands all day long  -Try some plain vegetable or beef or chicken broth (not low sodium)  Teaching Method Utilized:  Visual Auditory Hands on  Barriers to learning/adherence to lifestyle change: none identified   Demonstrated degree of understanding via:  Teach Back   Monitoring/Evaluation:  Dietary intake, exercise, and body weight.

## 2018-01-12 NOTE — Patient Instructions (Signed)
-  Add protein shakes to your coffee add a tsp of sugar to your coffee   -Try the chicken soup flavor unjury powder   -Call your doctor tomorrow if you are still having vomiting and diarrhea   -Keep liquid in your hands all day long   -Try some plain vegetable or beef or chicken broth (not low sodium)

## 2018-01-19 ENCOUNTER — Telehealth: Payer: Self-pay | Admitting: Skilled Nursing Facility1

## 2018-01-19 NOTE — Telephone Encounter (Signed)
Well Check. LVM.

## 2018-08-05 DIAGNOSIS — Z23 Encounter for immunization: Secondary | ICD-10-CM | POA: Diagnosis not present

## 2018-08-05 DIAGNOSIS — M542 Cervicalgia: Secondary | ICD-10-CM | POA: Diagnosis not present

## 2018-08-05 DIAGNOSIS — H6980 Other specified disorders of Eustachian tube, unspecified ear: Secondary | ICD-10-CM | POA: Diagnosis not present

## 2018-08-05 DIAGNOSIS — G4700 Insomnia, unspecified: Secondary | ICD-10-CM | POA: Diagnosis not present

## 2018-08-05 DIAGNOSIS — F4310 Post-traumatic stress disorder, unspecified: Secondary | ICD-10-CM | POA: Diagnosis not present

## 2018-08-12 DIAGNOSIS — I10 Essential (primary) hypertension: Secondary | ICD-10-CM | POA: Diagnosis not present

## 2018-08-12 DIAGNOSIS — R509 Fever, unspecified: Secondary | ICD-10-CM | POA: Diagnosis not present

## 2018-08-12 DIAGNOSIS — Z888 Allergy status to other drugs, medicaments and biological substances status: Secondary | ICD-10-CM | POA: Diagnosis not present

## 2018-08-12 DIAGNOSIS — R05 Cough: Secondary | ICD-10-CM | POA: Diagnosis not present

## 2018-08-12 DIAGNOSIS — Z886 Allergy status to analgesic agent status: Secondary | ICD-10-CM | POA: Diagnosis not present

## 2018-08-12 DIAGNOSIS — H6693 Otitis media, unspecified, bilateral: Secondary | ICD-10-CM | POA: Diagnosis not present

## 2018-08-12 DIAGNOSIS — F4310 Post-traumatic stress disorder, unspecified: Secondary | ICD-10-CM | POA: Diagnosis not present

## 2018-08-25 IMAGING — MG DIGITAL SCREENING BILATERAL MAMMOGRAM WITH CAD
6 series · 6 of 6 positions shown · non-contrast
Comparison: Previous exam(s).

CLINICAL DATA: Screening.

EXAM:
DIGITAL SCREENING BILATERAL MAMMOGRAM WITH CAD

[R MLO (1 of 2)]
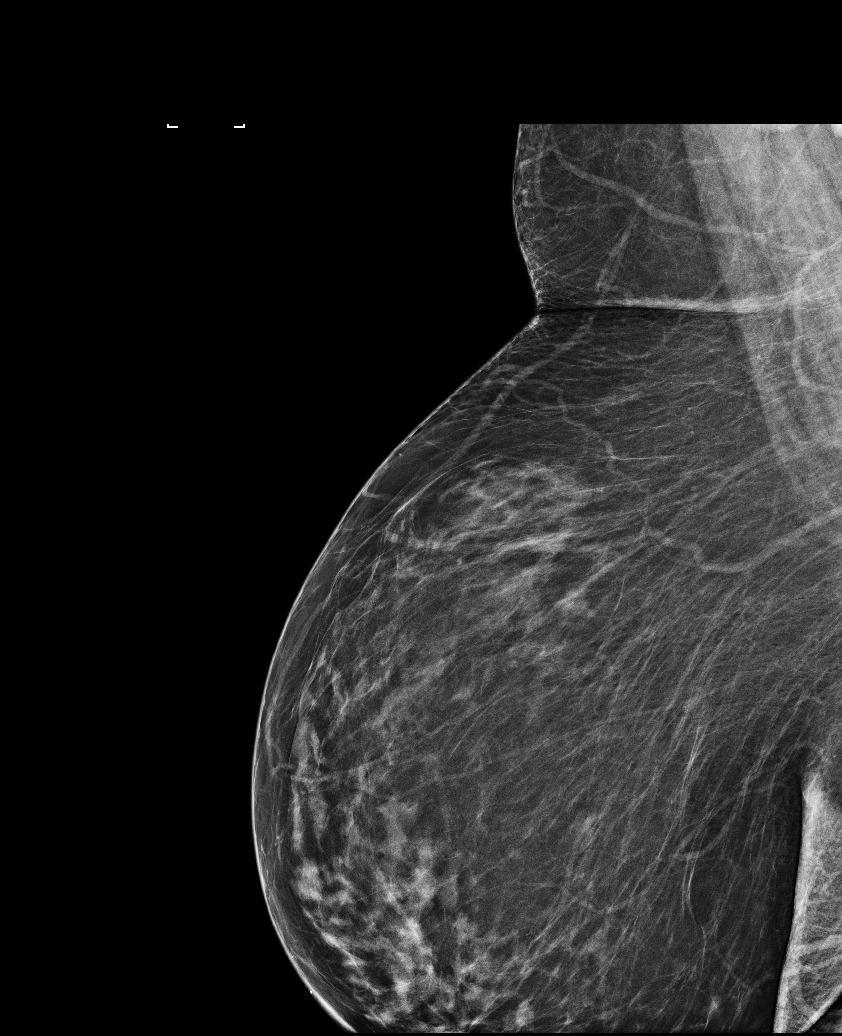

[L MLO (1 of 2)]
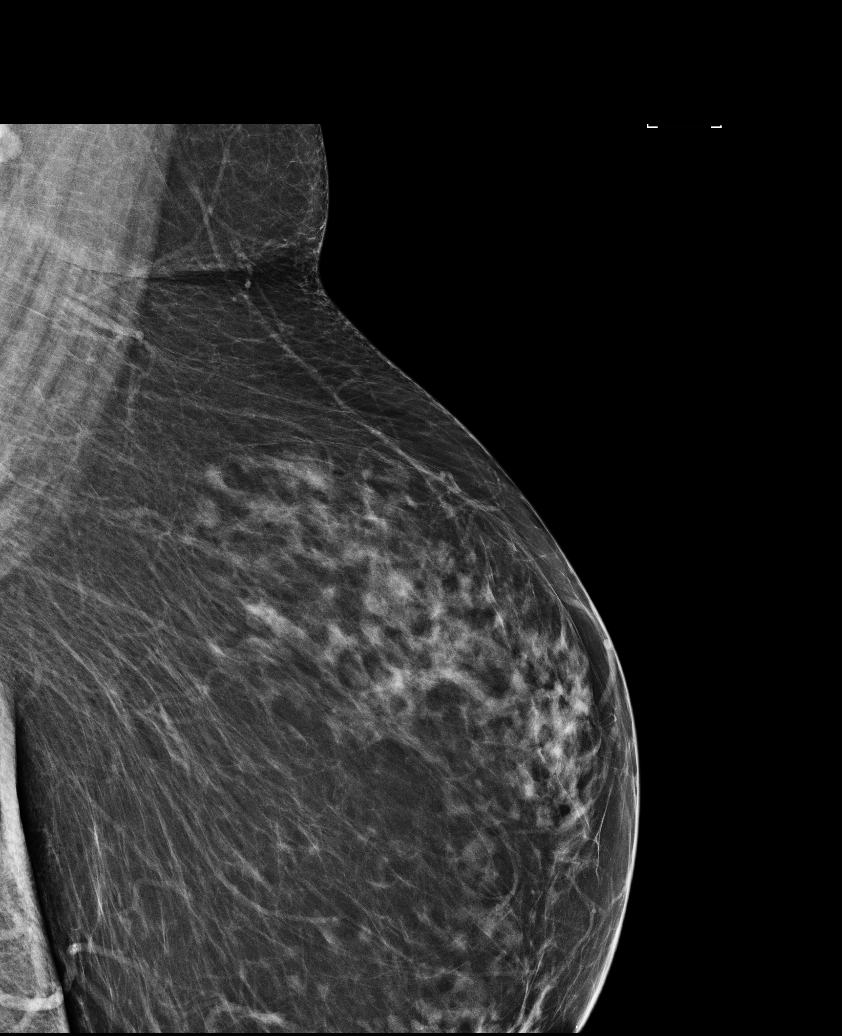

[R CC]
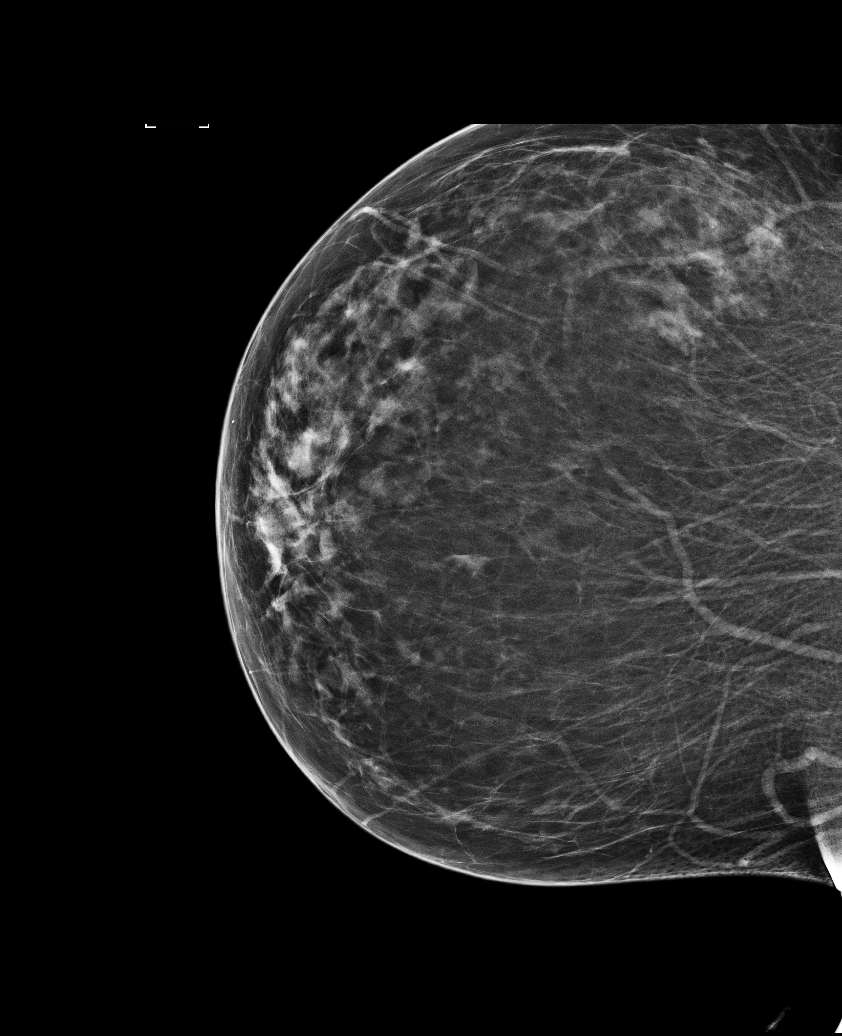

[R MLO (2 of 2)]
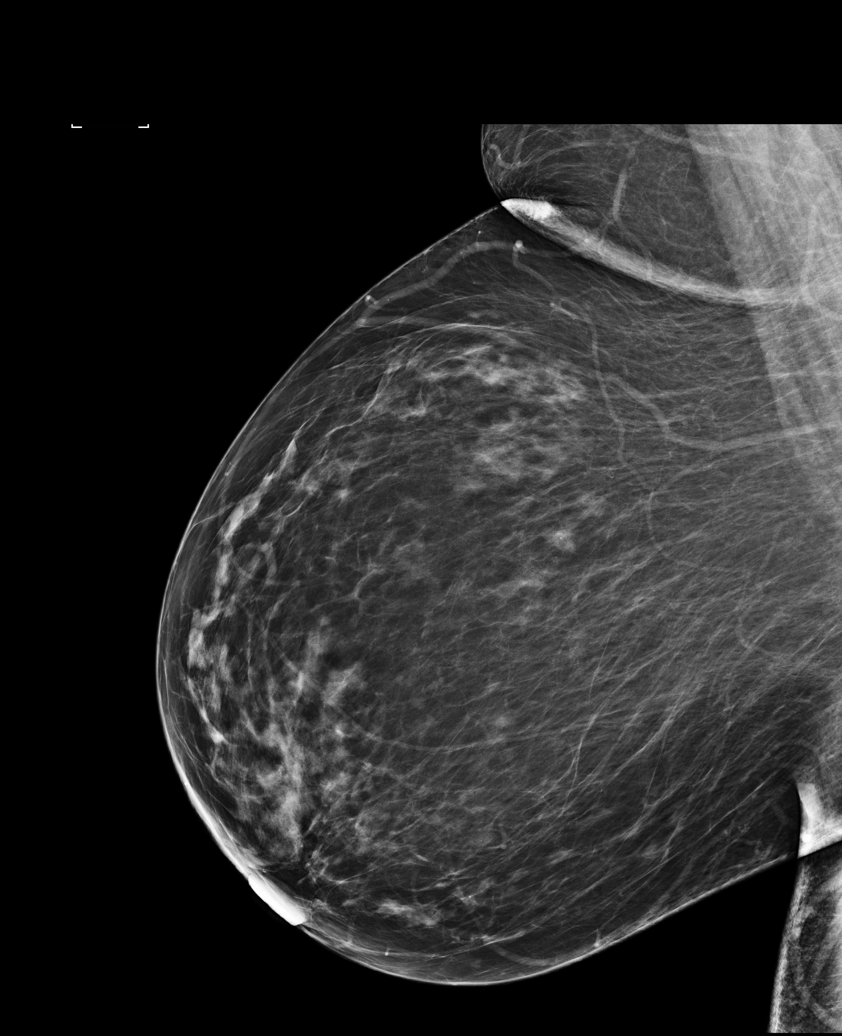

[L CC]
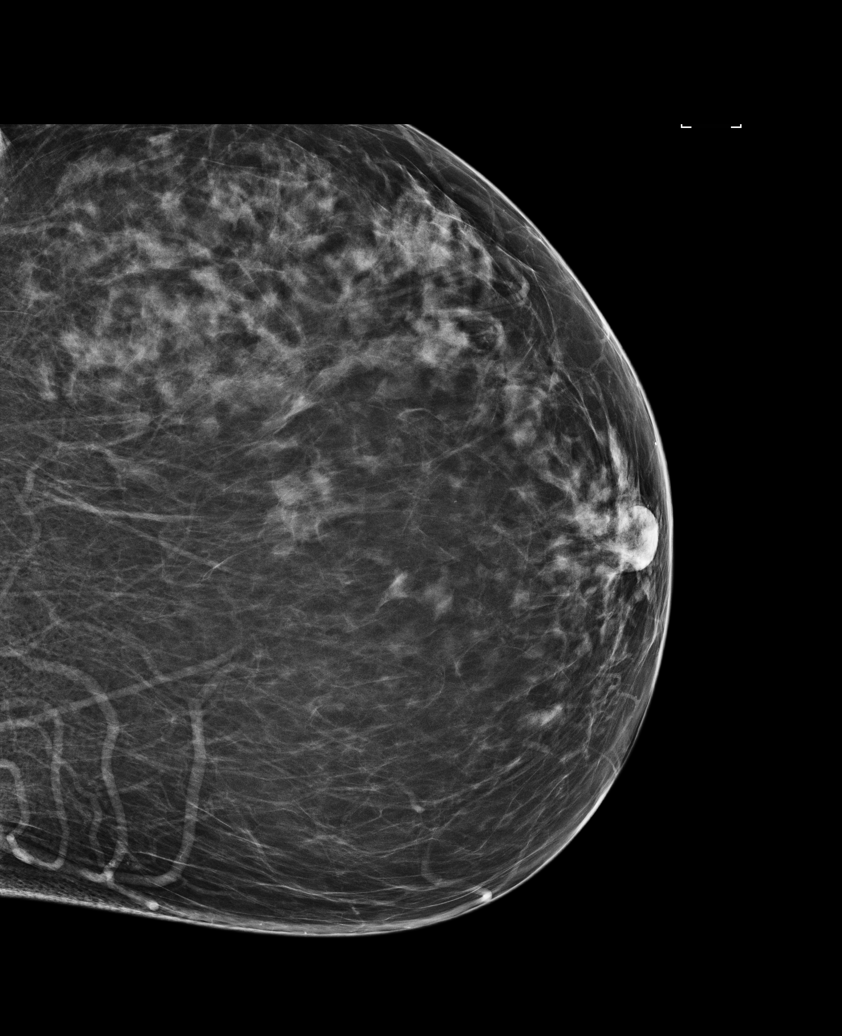

[L MLO (2 of 2)]
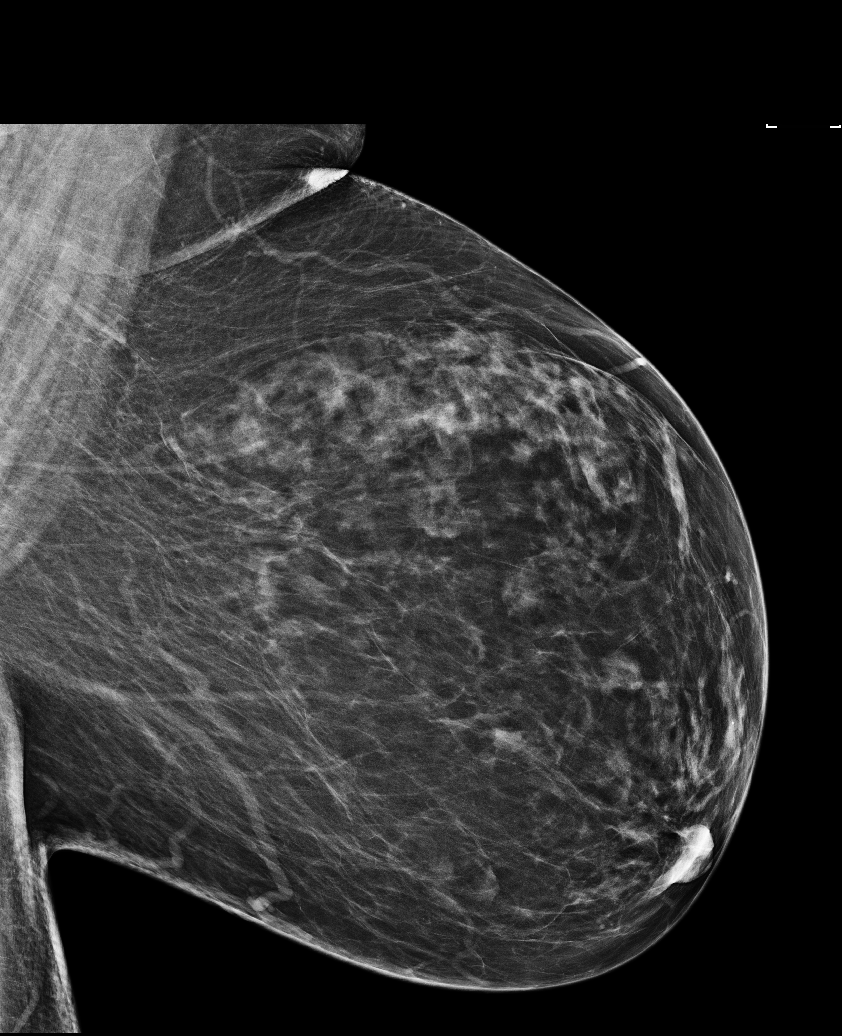

[6 of 6 positions shown; findings below may reference images not displayed]

ACR Breast Density Category b: There are scattered areas of
fibroglandular density.
FINDINGS: There are no findings suspicious for malignancy. Images were
processed with CAD.
IMPRESSION: No mammographic evidence of malignancy. A result letter of this
screening mammogram will be mailed directly to the patient.

RECOMMENDATION:
Screening mammogram in one year. (Code:AS-G-LCT)

BI-RADS CATEGORY  1: Negative.

## 2019-01-16 ENCOUNTER — Encounter (HOSPITAL_COMMUNITY): Payer: Self-pay

## 2019-04-29 ENCOUNTER — Emergency Department (HOSPITAL_COMMUNITY): Payer: Medicare Other

## 2019-04-29 ENCOUNTER — Encounter (HOSPITAL_COMMUNITY): Payer: Self-pay | Admitting: Emergency Medicine

## 2019-04-29 ENCOUNTER — Other Ambulatory Visit: Payer: Self-pay

## 2019-04-29 ENCOUNTER — Emergency Department (HOSPITAL_COMMUNITY)
Admission: EM | Admit: 2019-04-29 | Discharge: 2019-04-30 | Disposition: A | Discharge status: Home / Self Care | Payer: Medicare Other | Attending: Emergency Medicine | Admitting: Emergency Medicine

## 2019-04-29 DIAGNOSIS — S0990XA Unspecified injury of head, initial encounter: Secondary | ICD-10-CM | POA: Diagnosis present

## 2019-04-29 DIAGNOSIS — Y999 Unspecified external cause status: Secondary | ICD-10-CM | POA: Insufficient documentation

## 2019-04-29 DIAGNOSIS — M542 Cervicalgia: Secondary | ICD-10-CM | POA: Insufficient documentation

## 2019-04-29 DIAGNOSIS — Y929 Unspecified place or not applicable: Secondary | ICD-10-CM | POA: Diagnosis not present

## 2019-04-29 DIAGNOSIS — T148XXA Other injury of unspecified body region, initial encounter: Secondary | ICD-10-CM | POA: Diagnosis not present

## 2019-04-29 DIAGNOSIS — M25521 Pain in right elbow: Secondary | ICD-10-CM | POA: Insufficient documentation

## 2019-04-29 DIAGNOSIS — Y939 Activity, unspecified: Secondary | ICD-10-CM | POA: Diagnosis not present

## 2019-04-29 DIAGNOSIS — W11XXXA Fall on and from ladder, initial encounter: Secondary | ICD-10-CM | POA: Diagnosis not present

## 2019-04-29 DIAGNOSIS — M25552 Pain in left hip: Secondary | ICD-10-CM

## 2019-04-29 DIAGNOSIS — Z79899 Other long term (current) drug therapy: Secondary | ICD-10-CM | POA: Diagnosis not present

## 2019-04-29 DIAGNOSIS — W19XXXA Unspecified fall, initial encounter: Secondary | ICD-10-CM

## 2019-04-29 LAB — CBC WITH DIFFERENTIAL/PLATELET
Abs Immature Granulocytes: 0.03 10*3/uL (ref 0.00–0.07)
Basophils Absolute: 0 10*3/uL (ref 0.0–0.1)
Basophils Relative: 0 %
Eosinophils Absolute: 0.1 10*3/uL (ref 0.0–0.5)
Eosinophils Relative: 1 %
HCT: 37.5 % (ref 36.0–46.0)
Hemoglobin: 12.4 g/dL (ref 12.0–15.0)
Immature Granulocytes: 0 %
Lymphocytes Relative: 20 %
Lymphs Abs: 1.6 10*3/uL (ref 0.7–4.0)
MCH: 30.5 pg (ref 26.0–34.0)
MCHC: 33.1 g/dL (ref 30.0–36.0)
MCV: 92.1 fL (ref 80.0–100.0)
Monocytes Absolute: 0.6 10*3/uL (ref 0.1–1.0)
Monocytes Relative: 7 %
Neutro Abs: 5.8 10*3/uL (ref 1.7–7.7)
Neutrophils Relative %: 72 %
Platelets: 256 10*3/uL (ref 150–400)
RBC: 4.07 MIL/uL (ref 3.87–5.11)
RDW: 13.2 % (ref 11.5–15.5)
WBC: 8.2 10*3/uL (ref 4.0–10.5)
nRBC: 0 % (ref 0.0–0.2)

## 2019-04-29 LAB — BASIC METABOLIC PANEL
Anion gap: 9 (ref 5–15)
BUN: 16 mg/dL (ref 6–20)
CO2: 24 mmol/L (ref 22–32)
Calcium: 9.6 mg/dL (ref 8.9–10.3)
Chloride: 108 mmol/L (ref 98–111)
Creatinine, Ser: 0.69 mg/dL (ref 0.44–1.00)
GFR calc Af Amer: >60 mL/min (ref >60)
GFR calc non Af Amer: >60 mL/min (ref >60)
Glucose, Bld: 105 mg/dL — ABNORMAL HIGH (ref 70–99)
Potassium: 3.3 mmol/L — ABNORMAL LOW (ref 3.5–5.1)
Sodium: 141 mmol/L (ref 135–145)

## 2019-04-29 MED ORDER — MORPHINE SULFATE (PF) 4 MG/ML IV SOLN
4.0000 mg | Freq: Once | INTRAVENOUS | Status: AC
  Administered 2019-04-29: 4 mg via INTRAVENOUS
  Filled 2019-04-29: qty 1

## 2019-04-29 MED ORDER — HYDROMORPHONE HCL 1 MG/ML IJ SOLN
1.0000 mg | Freq: Once | INTRAMUSCULAR | Status: AC
  Administered 2019-04-29: 1 mg via INTRAVENOUS
  Filled 2019-04-29: qty 1

## 2019-04-29 NOTE — ED Triage Notes (Addendum)
Pt coming by EMS after falling off of the top step of a ladder (approx 5 ft.) tonight. Pt having head, neck, back pain, left hip pain and right elbow pain. LOC reported. 100 mcg Fentanyl given by EMS. Vitals WDL. Pt has hx of plates in back from E1-E0. C collar placed and maintained

## 2019-04-29 NOTE — ED Notes (Signed)
Patient transported to CT 

## 2019-04-30 NOTE — Discharge Instructions (Addendum)
Take ibuprofen and Tylenol as needed for mild to moderate pain.  Use your pain medication as needed for severe or breakthrough pain. Use your muscle relaxer as needed for muscle stiffness or soreness.   Use ice packs or heating pads if this helps control your pain. You will likely have continued muscle stiffness and soreness over the next couple days.  Follow-up with primary care in 1 week if your symptoms are not improving. Return to the emergency room if you develop vision changes, vomiting, slurred speech, numbness, loss of bowel or bladder control, or any new or worsening symptoms.

## 2019-04-30 NOTE — ED Notes (Signed)
Patient verbalizes understanding of discharge instructions. Opportunity for questioning and answers were provided. Armband removed by staff, pt discharged from ED by wheelchair   

## 2019-04-30 NOTE — ED Provider Notes (Signed)
[facility] EMERGENCY DEPARTMENT Provider Note   CSN: [id number] Arrival date & time: 04/29/19  2033     History   Chief Complaint Chief Complaint  Patient presents with   Fall    HPI [name] is a 53 y.o. female presenting for evaluation after family Patient states just after arrival she was on a ladder approximately 5 feet high when the ladder went 1 way causing her to fall the other way.  She reports hitting the right side of her head on the corner cabinet and lost consciousness.  She states she also landed on her left hip, is having severe left hip to foot pain.  She reports headache, left-sided neck pain, right elbow pain, left hip pain, left low back pain, and entire left leg pain.  She has been unable to ambulate due to pain since.  She denies vision changes, slurred speech, chest pain, shortness breath nausea, vomiting, abdominal pain, loss of bowel bladder control, numbness, tingling.  She has a history of c-spine plates.  Patient states she is not on blood thinners.     HPI  Past Medical History:  Diagnosis Date   Anxiety    Cancer ([place]) 1990   Cervical   Carpal tunnel syndrome, bilateral    Complication of anesthesia INTRAOP HYPERTENSION W/ TONSILLECTOMY SURGERY AGE 4--  NO ISSUES SINCE   Pt stated that she stopped breathing due. 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   S/P left knee arthroscopy 10/20/2012    Past Surgical History:  Procedure Laterality Date   ABDOMINAL HYSTERECTOMY     May 15, 2016   ANTERIOR CERVICAL DECOMP/DISCECTOMY FUSION  10-16-2010   C4  -  C6   CERVICAL BIOPSY  W/ LOOP ELECTRODE EXCISION  2010   CESAREAN SECTION  1987   CHOLECYSTECTOMY  2009   FOOT SURGERY  2010     LEFT   KNEE ARTHROSCOPY  10/20/2012   Procedure: ARTHROSCOPY KNEE;  Surgeon: Mauri Pole, MD;  Location: [facility];  Service: Orthopedics;  Laterality: Left;  medial menisectomy medial patellar condroplasty and sinovectomy   LAPAROSCOPIC GASTRIC SLEEVE RESECTION N/A 06/29/2017   Procedure: LAPAROSCOPIC GASTRIC SLEEVE RESECTION;  Surgeon: Johnathan Hausen, MD;  Location: [facility];  Service: General;  Laterality: N/A;   TONSILLECTOMY  AGE 4   TRANSTHORACIC ECHOCARDIOGRAM  06-20-2010   NORMAL LVSF/ EF 55%   TUBAL LIGATION  4709   UMBILICAL HERNIA REPAIR  06/29/2017   Procedure: HERNIA REPAIR UMBILICAL ADULT;  Surgeon: Johnathan Hausen, MD;  Location: [facility];  Service: General;;     OB History   No obstetric history on file.      Home Medications    Prior to Admission medications   Medication Sig Start Date End Date Taking? Authorizing Provider  ALPRAZolam Duanne Moron) 1 MG tablet Take 1 mg by mouth 4 (four) times daily.    Yes [provider]  CALCIUM PO Take 1 tablet by mouth daily.   Yes [provider]  Multiple Vitamin (MULTIVITAMIN WITH MINERALS) TABS tablet Take 1 tablet by mouth daily. Bariatric multi-vitamin   Yes [provider]  oxyCODONE-acetaminophen (PERCOCET) 10-325 MG tablet Take 1 tablet by mouth 3 (  three) times daily as needed for pain.    Yes [provider]  pantoprazole (PROTONIX) 40 MG tablet Take 40 mg by mouth daily as needed (acid reflux/indigestion).  07/25/17  Yes [provider]  sertraline (ZOLOFT) 100 MG tablet Take 200 mg by mouth at bedtime.    Yes [provider]  tiZANidine (ZANAFLEX) 2 MG tablet Take 2 mg by mouth 3 (three) times daily as needed for muscle spasms.   Yes [provider]  traZODone (DESYREL) 50 MG tablet Take 100 mg by mouth at bedtime as needed for sleep.    Yes [provider]    Family History Family History  Adopted: Yes  Problem Relation Age  of Onset   Cancer Mother    Diabetes Mother    Cancer Paternal Grandmother     Social History Social History   Tobacco Use   Smoking status: Never Smoker   Smokeless tobacco: Never Used  Substance Use Topics   Alcohol use: No   Drug use: No     Allergies   Cortisone, Naproxen sodium, Prednisone, and Tramadol   Review of Systems Review of Systems  Musculoskeletal: Positive for arthralgias, back pain and myalgias.  Neurological: Positive for headaches.  All other systems reviewed and are negative.    Physical Exam Updated Vital Signs BP 123/81    Pulse 99    Temp 98.2 F (36.8 C) (Oral)    Resp (!) 25    Ht 5' (1.524 m)    Wt 90.7 kg    LMP 06/21/2015    SpO2 95%    BMI 39.06 kg/m   Physical Exam Vitals signs and nursing note reviewed.  Constitutional:      General: She is not in acute distress.    Appearance: She is well-developed.     Comments: Obese female laying in the bed who appears uncomfortable due to pain, but nontoxic in appearance  HENT:     Head: Normocephalic and atraumatic.     Comments: No obvious head trauma, contusion, or laceration. Eyes:     Extraocular Movements: Extraocular movements intact.     Conjunctiva/sclera: Conjunctivae normal.     Pupils: Pupils are equal, round, and reactive to light.  Neck:     Musculoskeletal: Muscular tenderness present.     Comments: In c-collar.  Tenderness palpation of left-sided cervical musculature and C-spine.  No obvious step-offs. Cardiovascular:     Rate and Rhythm: Normal rate and regular rhythm.     Pulses: Normal pulses.  Pulmonary:     Effort: Pulmonary effort is normal. No respiratory distress.     Breath sounds: Normal breath sounds. No wheezing.  Abdominal:     General: There is no distension.     Palpations: Abdomen is soft. There is no mass.     Tenderness: There is no abdominal tenderness. There is no guarding or rebound.  Musculoskeletal:        General: Tenderness present.      Comments: Tenderness to palpation of entire left lower extremity.  No obvious focal deficit.  Mild left lower extremity swelling when compared to the right.  Pedal pulses intact bilaterally.  Sensation tight bilaterally.  No saddle paresthesia. Increased tenderness palpation of the left hip.  No obvious pelvis instability. Right elbow with contusion and tenderness.  No obvious deformity of the forearm or upper arm.  Skin:    General: Skin is warm and dry.     Capillary Refill: Capillary refill takes  less than 2 seconds.  Neurological:     Mental Status: She is alert and oriented to person, place, and time.      ED Treatments / Results  Labs (all labs ordered are listed, but only abnormal results are displayed) Labs Reviewed  BASIC METABOLIC PANEL - Abnormal; Notable for the following components:      Result Value   Potassium 3.3 (*)    Glucose, Bld 105 (*)    All other components within normal limits  CBC WITH DIFFERENTIAL/PLATELET    EKG    Radiology Dg Elbow Complete Right  Result Date: 04/29/2019 CLINICAL DATA:  Fall off ladder from 5 ft height. Right elbow pain. Initial encounter. EXAM: RIGHT ELBOW - COMPLETE 3+ VIEW COMPARISON:  None. FINDINGS: There is no evidence of fracture, dislocation, or joint effusion. Enthesopathic changes are seen arising from the medial and lateral epicondyles. Soft tissues are unremarkable. IMPRESSION: No acute findings. Electronically Signed   By: Marlaine Hind M.D.   On: 04/29/2019 22:09   Dg Ankle Complete Left  Result Date: 04/29/2019 CLINICAL DATA:  Fall off ladder from 5 ft height. Left ankle pain. Initial encounter. EXAM: LEFT ANKLE COMPLETE - 3+ VIEW COMPARISON:  None. FINDINGS: There is no evidence of fracture, dislocation, or joint effusion. There is no evidence of arthropathy or other focal bone abnormality. Soft tissues are unremarkable. IMPRESSION: Negative. Electronically Signed   By: Marlaine Hind M.D.   On: 04/29/2019 22:09   Ct Head  Wo Contrast  Result Date: 04/29/2019 CLINICAL DATA:  Fall from ladder. Head, neck, back, and left hip pain. EXAM: CT HEAD WITHOUT CONTRAST CT CERVICAL SPINE WITHOUT CONTRAST TECHNIQUE: Multidetector CT imaging of the head and cervical spine was performed following the standard protocol without intravenous contrast. Multiplanar CT image reconstructions of the cervical spine were also generated. COMPARISON:  Cervical spine MRI 10/07/2017 FINDINGS: CT HEAD FINDINGS Brain: There is no evidence of acute infarct, intracranial hemorrhage, mass, midline shift, or extra-axial fluid collection. The ventricles and sulci are normal. Vascular: No hyperdense vessel. Skull: No fracture or focal osseous lesion. Sinuses/Orbits: Trace fluid in the sphenoid sinuses. Trace left maxillary sinus mucosal thickening. Clear mastoid air cells. Unremarkable orbits. Other: None. CT CERVICAL SPINE FINDINGS Alignment: Cervical spine straightening. No listhesis. Skull base and vertebrae: No acute fracture or suspicious osseous lesion. Prior C3-C7 ACDF with solid arthrodesis at each level. Soft tissues and spinal canal: No prevertebral fluid or swelling. No visible canal hematoma. Disc levels: No significant osseous spinal canal or neural foraminal stenosis. Upper chest: Clear lung apices. Other: None. IMPRESSION: 1. No evidence of acute intracranial abnormality. 2. No evidence of acute fracture or subluxation in the cervical spine. Electronically Signed   By: Logan Bores M.D.   On: 04/29/2019 21:58   Ct Cervical Spine Wo Contrast  Result Date: 04/29/2019 CLINICAL DATA:  Fall from ladder. Head, neck, back, and left hip pain. EXAM: CT HEAD WITHOUT CONTRAST CT CERVICAL SPINE WITHOUT CONTRAST TECHNIQUE: Multidetector CT imaging of the head and cervical spine was performed following the standard protocol without intravenous contrast. Multiplanar CT image reconstructions of the cervical spine were also generated. COMPARISON:  Cervical spine MRI  10/07/2017 FINDINGS: CT HEAD FINDINGS Brain: There is no evidence of acute infarct, intracranial hemorrhage, mass, midline shift, or extra-axial fluid collection. The ventricles and sulci are normal. Vascular: No hyperdense vessel. Skull: No fracture or focal osseous lesion. Sinuses/Orbits: Trace fluid in the sphenoid sinuses. Trace left maxillary sinus mucosal thickening. Clear mastoid  air cells. Unremarkable orbits. Other: None. CT CERVICAL SPINE FINDINGS Alignment: Cervical spine straightening. No listhesis. Skull base and vertebrae: No acute fracture or suspicious osseous lesion. Prior C3-C7 ACDF with solid arthrodesis at each level. Soft tissues and spinal canal: No prevertebral fluid or swelling. No visible canal hematoma. Disc levels: No significant osseous spinal canal or neural foraminal stenosis. Upper chest: Clear lung apices. Other: None. IMPRESSION: 1. No evidence of acute intracranial abnormality. 2. No evidence of acute fracture or subluxation in the cervical spine. Electronically Signed   By: Logan Bores M.D.   On: 04/29/2019 21:58   Ct Lumbar Spine Wo Contrast  Result Date: 04/29/2019 CLINICAL DATA:  Fall from ladder. Head, neck, back, and left hip pain. EXAM: CT LUMBAR SPINE WITHOUT CONTRAST TECHNIQUE: Multidetector CT imaging of the lumbar spine was performed without intravenous contrast administration. Multiplanar CT image reconstructions were also generated. COMPARISON:  Lumbar spine MRI 08/05/2016 FINDINGS: Segmentation: Partially sacralized L5. Absent ribs at T12. Alignment: Slight left convex curvature of the lumbar spine. Unchanged trace retrolisthesis of T12 on L1, L1 on L2, L2 on L3, and L3 on L4 and trace anterolisthesis of L4 on L5. Vertebrae: No acute fracture or suspicious osseous lesion. Moderate lower thoracic spondylosis. Paraspinal and other soft tissues: Status post gastric sleeve. Disc levels: Similar appearance of lumbar disc degeneration without high-grade spinal stenosis.  Severe L4-5 facet arthrosis with anterolisthesis and bulging uncovered disc resulting in moderate right and mild left neural foraminal stenosis. Moderate right neural foraminal stenosis at L1-2 and L2-3 as well. IMPRESSION: 1. No evidence of acute osseous abnormality. 2. Similar appearance of lumbar disc and facet degeneration. Electronically Signed   By: Logan Bores M.D.   On: 04/29/2019 21:51   Dg Knee Complete 4 Views Left  Result Date: 04/29/2019 CLINICAL DATA:  Fall off ladder from 5 ft height. Left knee pain. Initial encounter. EXAM: LEFT KNEE - COMPLETE 4+ VIEW COMPARISON:  None. FINDINGS: No evidence of acute fracture or dislocation. No evidence of knee joint effusion. 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 IMPRESSION: 1. No acute findings. 2. Moderate to severe tricompartmental osteoarthritis. Electronically Signed   By: Marlaine Hind M.D.   On: 04/29/2019 22:06   Dg Foot Complete Left  Result Date: 04/29/2019 CLINICAL DATA:  Fall off ladder from 5 ft height. Left foot pain. Initial encounter. EXAM: LEFT FOOT - COMPLETE 3+ VIEW COMPARISON:  None. FINDINGS: There is no evidence of fracture or dislocation. Postop changes from previous bunionectomy noted. Prominent dorsal and plantar calcaneal spurs also seen. IMPRESSION: No acute findings. Electronically Signed   By: Marlaine Hind M.D.   On: 04/29/2019 22:08   Dg Hips Bilat W Or Wo Pelvis 3-4 Views  Result Date: 04/29/2019 CLINICAL DATA:  Fall off ladder from 5 ft height. Bilateral hip pain. Initial encounter. EXAM: DG HIP (WITH OR WITHOUT PELVIS) 3-4V BILAT COMPARISON:  None. FINDINGS: There is no evidence of hip fracture or dislocation. Mild bilateral hip osteoarthritis noted. Degenerative spondylosis involving the pubic symphysis and lower lumbar spine. IMPRESSION: No acute findings. Electronically Signed   By: Marlaine Hind M.D.   On: 04/29/2019 22:06    Procedures Procedures (including critical care time)  Medications  Ordered in ED Medications  morphine 4 MG/ML injection 4 mg (4 mg Intravenous Given 04/29/19 2105)  HYDROmorphone (DILAUDID) injection 1 mg (1 mg Intravenous Given 04/29/19 2316)     Initial Impression / Assessment and Plan / ED Course  I have reviewed the  triage vital signs and the nursing notes.  Pertinent labs & imaging results that were available during my care of the patient were reviewed by me and considered in my medical decision making (see chart for details).        Patient presenting for evaluation after a fall.  Zickel examination, she is neurovascularly intact.  No obvious neurologic deficits.  Tenderness palpation of left lower extremity and pelvis.  Pain is palpation in neck.  Patient reports having her head no loss of consciousness.  Will obtain labs, CT head and neck, CT lumbar spine, and x-ray of the lower extremity and right elbow.  CT head, neck, and spine negative for fracture or dislocation.  Negative for intracranial bleed.  X-rays viewed and interpreted by me, no fractures or dislocations.  Discussed findings with patient.  Discussed likely muscle pain/strain.  Discussed symptomatic treatment with pain control and muscle relaxers.  Will give pain medication in the ED and ensure that patient is able to ambulate.  Patient ambulated to the bathroom with pain, but able to do so independently.  Discussed continued pain control at home, patient has pain medication and muscle relaxers at home.  Encouraged follow-up with primary care if symptoms not improving.  At this time, patient appears safe for discharge.  Return precautions given.  Patient states she understands and agrees to plan.  Final Clinical Impressions(s) / ED Diagnoses   Final diagnoses:  Fall, initial encounter  Muscle strain  Left hip pain  Right elbow pain    ED Discharge Orders    None       Franchot Heidelberg, PA-C 04/30/19 6803    Gareth Morgan, MD 05/01/19 2128

## 2019-08-03 NOTE — Patient Instructions (Addendum)
DUE TO COVID-19 ONLY ONE VISITOR IS ALLOWED TO COME WITH YOU AND STAY IN THE WAITING ROOM ONLY DURING PRE OP AND PROCEDURE DAY OF SURGERY. THE 1 VISITOR MAY VISIT WITH YOU AFTER SURGERY IN YOUR PRIVATE ROOM DURING VISITING HOURS ONLY!  YOU NEED TO HAVE A COVID 19 TEST ON___Monday 10-12-2020____ @___10 :00AM____, THIS TEST MUST BE DONE BEFORE SURGERY, COME  [address], [place] , [address].  ([place]) ONCE YOUR COVID TEST IS COMPLETED, PLEASE BEGIN THE QUARANTINE INSTRUCTIONS AS OUTLINED IN YOUR HANDOUT.                [name]   Your procedure is scheduled on: 08-17-19    Report to [facility]  Entrance    Report to Admitting at 7:25 AM     Call this number if you have problems the morning of surgery [phone]    Remember: NO SOLID FOOD AFTER MIDNIGHT THE NIGHT PRIOR TO SURGERY. NOTHING BY MOUTH EXCEPT CLEAR LIQUIDS UNTIL 6:55 AM . PLEASE FINISH ENSURE DRINK PER SURGEON ORDER  WHICH NEEDS TO BE COMPLETED AT 6:55 AM .   CLEAR LIQUID DIET   Foods Allowed                                                                     Foods Excluded  Coffee and tea, regular and decaf                             liquids that you cannot  Plain Jell-O any favor except red or purple                                           see through such as: Fruit ices (not with fruit pulp)                                     milk, soups, orange juice  Iced Popsicles                                    All solid food Carbonated beverages, regular and diet                                    Cranberry, grape and apple juices Sports drinks like Gatorade Lightly seasoned clear broth or consume(fat free) Sugar, honey syrup  Sample Menu Breakfast                                Lunch                                     Supper Cranberry juice  Beef broth                            Chicken broth Jell-O                                     Grape juice                            Apple juice Coffee or tea                        Jell-O                                      Popsicle                                                Coffee or tea                        Coffee or tea  _____________________________________________________________________     Take these medicines the morning of surgery with A SIP OF WATER: Xanax if needed, Protonix, Sertraline   BRUSH YOUR TEETH MORNING OF SURGERY AND RINSE YOUR MOUTH OUT, NO CHEWING GUM CANDY OR MINTS.                                   You may not have any metal on your body including hair pins and              piercings  Do not wear jewelry, make-up, lotions, powders or perfumes, deodorant             Do not wear nail polish on your fingernails.  Do not shave  48 hours prior to surgery.            Do not bring valuables to the hospital. [place].  Contacts, dentures or bridgework may not be worn into surgery.       Patients discharged the day of surgery will not be allowed to drive home. IF YOU ARE HAVING SURGERY AND GOING HOME THE SAME DAY, YOU MUST HAVE AN ADULT TO DRIVE YOU HOME AND BE WITH YOU FOR 24 HOURS. YOU MAY GO HOME BY TAXI OR UBER OR ORTHERWISE, BUT AN ADULT MUST ACCOMPANY YOU HOME AND STAY WITH YOU FOR 24 HOURS.  Name and phone number of your driver:  Special Instructions: N/A               Please read over the following fact sheets you were given: _____________________________________________________________________             [facility] - Preparing for Surgery Before surgery, you can play an important role.  Because skin is not sterile, your skin needs to be as free of germs as possible.  You can reduce the number of germs on your skin by washing with CHG (chlorahexidine  gluconate) soap before surgery.  CHG is an antiseptic cleaner which kills germs and bonds with the skin to continue killing germs even after washing. Please  DO NOT use if you have an allergy to CHG or antibacterial soaps.  If your skin becomes reddened/irritated stop using the CHG and inform your nurse when you arrive at Short Stay. Do not shave (including legs and underarms) for at least 48 hours prior to the first CHG shower.  You may shave your face/neck. Please follow these instructions carefully:  1.  Shower with CHG Soap the night before surgery and the  morning of Surgery.  2.  If you choose to wash your hair, wash your hair first as usual with your  normal  shampoo.  3.  After you shampoo, rinse your hair and body thoroughly to remove the  shampoo.                           4.  Use CHG as you would any other liquid soap.  You can apply chg directly  to the skin and wash                       Gently with a scrungie or clean washcloth.  5.  Apply the CHG Soap to your body ONLY FROM THE NECK DOWN.   Do not use on face/ open                           Wound or open sores. Avoid contact with eyes, ears mouth and genitals (private parts).                       Wash face,  Genitals (private parts) with your normal soap.             6.  Wash thoroughly, paying special attention to the area where your surgery  will be performed.  7.  Thoroughly rinse your body with warm water from the neck down.  8.  DO NOT shower/wash with your normal soap after using and rinsing off  the CHG Soap.                9.  Pat yourself dry with a clean towel.            10.  Wear clean pajamas.            11.  Place clean sheets on your bed the night of your first shower and do not  sleep with pets. Day of Surgery : Do not apply any lotions/deodorants the morning of surgery.  Please wear clean clothes to the hospital/surgery center.  FAILURE TO FOLLOW THESE INSTRUCTIONS MAY RESULT IN THE CANCELLATION OF YOUR SURGERY PATIENT SIGNATURE_________________________________  NURSE  SIGNATURE__________________________________  ________________________________________________________________________

## 2019-08-07 ENCOUNTER — Other Ambulatory Visit: Payer: Self-pay

## 2019-08-07 ENCOUNTER — Encounter (HOSPITAL_COMMUNITY)
Admission: RE | Admit: 2019-08-07 | Discharge: 2019-08-07 | Disposition: A | Discharge status: Home / Self Care | Payer: Medicare Other | Source: Ambulatory Visit | Attending: Orthopedic Surgery | Admitting: Orthopedic Surgery

## 2019-08-07 ENCOUNTER — Encounter (HOSPITAL_COMMUNITY): Payer: Self-pay

## 2019-08-07 ENCOUNTER — Encounter (HOSPITAL_COMMUNITY): Payer: Self-pay | Admitting: Physician Assistant

## 2019-08-07 DIAGNOSIS — Z01812 Encounter for preprocedural laboratory examination: Secondary | ICD-10-CM | POA: Diagnosis present

## 2019-08-07 DIAGNOSIS — M25812 Other specified joint disorders, left shoulder: Secondary | ICD-10-CM | POA: Insufficient documentation

## 2019-08-07 DIAGNOSIS — M19012 Primary osteoarthritis, left shoulder: Secondary | ICD-10-CM | POA: Insufficient documentation

## 2019-08-07 LAB — CBC
HCT: 39.6 % (ref 36.0–46.0)
Hemoglobin: 12.9 g/dL (ref 12.0–15.0)
MCH: 30.8 pg (ref 26.0–34.0)
MCHC: 32.6 g/dL (ref 30.0–36.0)
MCV: 94.5 fL (ref 80.0–100.0)
Platelets: 257 10*3/uL (ref 150–400)
RBC: 4.19 MIL/uL (ref 3.87–5.11)
RDW: 13.2 % (ref 11.5–15.5)
WBC: 5.6 10*3/uL (ref 4.0–10.5)
nRBC: 0 % (ref 0.0–0.2)

## 2019-08-07 NOTE — Progress Notes (Addendum)
PCP - Lawerance Cruel, MD Cardiologist -   Chest x-ray -  EKG -  Stress Test - epic 2018 ECHO -  Cardiac Cath -   Sleep Study -  CPAP - hx of osa, never used a CPAP or any other OSA device, reports she lost weight and now breathing in sleep has improved greatly   Fasting Blood Sugar -  Checks Blood Sugar _____ times a day  Blood Thinner Instructions: Aspirin Instructions: Last Dose:  Anesthesia review:   Patient reports in 2011 during cervical disc surgery , the doctor told her she stopped breathing 43 times. She was sent for sleep study and was diagnosed with OSA but never started on any OSA devices due to cost. She had lap gastric sleeve surgery in 2018 and reports improvement in breathing and sleep as a result of the weight loss. Also in 2018, she went to ER for chest pain , underwent stress test with normal results and denies recurrence of chest pain since that time . APP made aware of the above . Per APP request , patient asked if she has a PCP. Patient confirms that her PCP is Lawerance Cruel, MD and she last saw him 4 months ago via virtual visit. She reports she generally sees him every 6 months in person but due to the pandemic had to see him virtually however there were no new health concerns at that time    Patient denies shortness of breath, fever, cough and chest pain at PAT appointment   Patient verbalized understanding of instructions that were given to them at the PAT appointment. Patient was also instructed that they will need to review over the PAT instructions again at home before surgery.

## 2019-08-14 ENCOUNTER — Inpatient Hospital Stay (HOSPITAL_COMMUNITY): Admission: RE | Admit: 2019-08-14 | Payer: Medicare Other | Source: Ambulatory Visit

## 2019-08-17 ENCOUNTER — Encounter (HOSPITAL_COMMUNITY): Admission: RE | Payer: Self-pay | Source: Ambulatory Visit

## 2019-08-17 ENCOUNTER — Ambulatory Visit (HOSPITAL_COMMUNITY): Admission: RE | Admit: 2019-08-17 | Payer: Medicare Other | Source: Ambulatory Visit | Admitting: Orthopedic Surgery

## 2019-08-17 SURGERY — SHOULDER ARTHROSCOPY WITH SUBACROMIAL DECOMPRESSION AND DISTAL CLAVICLE EXCISION
Anesthesia: General | Laterality: Left

## 2019-12-06 ENCOUNTER — Telehealth: Payer: Self-pay | Admitting: Nurse Practitioner

## 2019-12-06 NOTE — Telephone Encounter (Signed)
Called to Discuss with patient about Covid symptoms and the use of bamlanivimab, a monoclonal antibody infusion for those with mild to moderate Covid symptoms and at a high risk of hospitalization.     Pt is qualified for this infusion at the [facility] center due to co-morbid conditions and/or a member of an at-risk group.     Unable to reach pt  

## 2020-01-02 ENCOUNTER — Encounter (HOSPITAL_COMMUNITY): Payer: Self-pay

## 2020-07-04 ENCOUNTER — Other Ambulatory Visit: Payer: Self-pay | Admitting: Family Medicine

## 2020-07-04 DIAGNOSIS — R1031 Right lower quadrant pain: Secondary | ICD-10-CM

## 2020-07-22 ENCOUNTER — Other Ambulatory Visit: Payer: Self-pay

## 2020-07-22 ENCOUNTER — Ambulatory Visit
Admission: RE | Admit: 2020-07-22 | Discharge: 2020-07-22 | Disposition: A | Discharge status: Home / Self Care | Payer: Medicare Other | Source: Ambulatory Visit | Attending: Family Medicine | Admitting: Family Medicine

## 2020-07-22 DIAGNOSIS — R1031 Right lower quadrant pain: Secondary | ICD-10-CM

## 2020-11-04 DIAGNOSIS — E559 Vitamin D deficiency, unspecified: Secondary | ICD-10-CM | POA: Diagnosis not present

## 2020-11-04 DIAGNOSIS — R9431 Abnormal electrocardiogram [ECG] [EKG]: Secondary | ICD-10-CM | POA: Diagnosis not present

## 2020-11-12 DIAGNOSIS — M25561 Pain in right knee: Secondary | ICD-10-CM | POA: Diagnosis not present

## 2020-11-12 DIAGNOSIS — Z9889 Other specified postprocedural states: Secondary | ICD-10-CM | POA: Diagnosis not present

## 2020-11-12 DIAGNOSIS — M542 Cervicalgia: Secondary | ICD-10-CM | POA: Diagnosis not present

## 2020-11-12 DIAGNOSIS — G8929 Other chronic pain: Secondary | ICD-10-CM | POA: Diagnosis not present

## 2020-11-12 DIAGNOSIS — Z79899 Other long term (current) drug therapy: Secondary | ICD-10-CM | POA: Diagnosis not present

## 2020-11-15 DIAGNOSIS — Z886 Allergy status to analgesic agent status: Secondary | ICD-10-CM | POA: Diagnosis not present

## 2020-11-15 DIAGNOSIS — S0990XA Unspecified injury of head, initial encounter: Secondary | ICD-10-CM | POA: Diagnosis not present

## 2020-11-15 DIAGNOSIS — Z888 Allergy status to other drugs, medicaments and biological substances status: Secondary | ICD-10-CM | POA: Diagnosis not present

## 2020-11-15 DIAGNOSIS — S0993XA Unspecified injury of face, initial encounter: Secondary | ICD-10-CM | POA: Diagnosis not present

## 2020-11-15 DIAGNOSIS — G8911 Acute pain due to trauma: Secondary | ICD-10-CM | POA: Diagnosis not present

## 2020-11-15 DIAGNOSIS — Z79899 Other long term (current) drug therapy: Secondary | ICD-10-CM | POA: Diagnosis not present

## 2020-11-15 DIAGNOSIS — I10 Essential (primary) hypertension: Secondary | ICD-10-CM | POA: Diagnosis not present

## 2020-11-15 DIAGNOSIS — W19XXXA Unspecified fall, initial encounter: Secondary | ICD-10-CM | POA: Diagnosis not present

## 2020-11-15 DIAGNOSIS — M542 Cervicalgia: Secondary | ICD-10-CM | POA: Diagnosis not present

## 2020-11-15 DIAGNOSIS — S0031XA Abrasion of nose, initial encounter: Secondary | ICD-10-CM | POA: Diagnosis not present

## 2020-11-15 DIAGNOSIS — Z9889 Other specified postprocedural states: Secondary | ICD-10-CM | POA: Diagnosis not present

## 2020-11-15 DIAGNOSIS — R58 Hemorrhage, not elsewhere classified: Secondary | ICD-10-CM | POA: Diagnosis not present

## 2020-11-15 DIAGNOSIS — Z885 Allergy status to narcotic agent status: Secondary | ICD-10-CM | POA: Diagnosis not present

## 2020-11-15 DIAGNOSIS — S199XXA Unspecified injury of neck, initial encounter: Secondary | ICD-10-CM | POA: Diagnosis not present

## 2020-12-05 DIAGNOSIS — Z20822 Contact with and (suspected) exposure to covid-19: Secondary | ICD-10-CM | POA: Diagnosis not present

## 2020-12-05 DIAGNOSIS — R9431 Abnormal electrocardiogram [ECG] [EKG]: Secondary | ICD-10-CM | POA: Diagnosis not present

## 2020-12-05 DIAGNOSIS — E559 Vitamin D deficiency, unspecified: Secondary | ICD-10-CM | POA: Diagnosis not present

## 2020-12-17 DIAGNOSIS — Z79899 Other long term (current) drug therapy: Secondary | ICD-10-CM | POA: Diagnosis not present

## 2020-12-17 DIAGNOSIS — G8918 Other acute postprocedural pain: Secondary | ICD-10-CM | POA: Diagnosis not present

## 2020-12-17 DIAGNOSIS — M25561 Pain in right knee: Secondary | ICD-10-CM | POA: Diagnosis not present

## 2020-12-17 DIAGNOSIS — M542 Cervicalgia: Secondary | ICD-10-CM | POA: Diagnosis not present

## 2020-12-17 DIAGNOSIS — G8929 Other chronic pain: Secondary | ICD-10-CM | POA: Diagnosis not present

## 2021-01-02 DIAGNOSIS — R9431 Abnormal electrocardiogram [ECG] [EKG]: Secondary | ICD-10-CM | POA: Diagnosis not present

## 2021-01-02 DIAGNOSIS — Z79899 Other long term (current) drug therapy: Secondary | ICD-10-CM | POA: Diagnosis not present

## 2021-01-02 DIAGNOSIS — R739 Hyperglycemia, unspecified: Secondary | ICD-10-CM | POA: Diagnosis not present

## 2021-01-02 DIAGNOSIS — E559 Vitamin D deficiency, unspecified: Secondary | ICD-10-CM | POA: Diagnosis not present

## 2021-01-13 DIAGNOSIS — E559 Vitamin D deficiency, unspecified: Secondary | ICD-10-CM | POA: Diagnosis not present

## 2021-01-13 DIAGNOSIS — H6980 Other specified disorders of Eustachian tube, unspecified ear: Secondary | ICD-10-CM | POA: Diagnosis not present

## 2021-01-14 DIAGNOSIS — M542 Cervicalgia: Secondary | ICD-10-CM | POA: Diagnosis not present

## 2021-01-14 DIAGNOSIS — G8929 Other chronic pain: Secondary | ICD-10-CM | POA: Diagnosis not present

## 2021-01-14 DIAGNOSIS — Z9889 Other specified postprocedural states: Secondary | ICD-10-CM | POA: Diagnosis not present

## 2021-01-14 DIAGNOSIS — Z79899 Other long term (current) drug therapy: Secondary | ICD-10-CM | POA: Diagnosis not present

## 2021-01-14 DIAGNOSIS — M25561 Pain in right knee: Secondary | ICD-10-CM | POA: Diagnosis not present

## 2021-01-16 ENCOUNTER — Encounter (HOSPITAL_COMMUNITY): Payer: Self-pay | Admitting: *Deleted

## 2021-01-30 DIAGNOSIS — R9431 Abnormal electrocardiogram [ECG] [EKG]: Secondary | ICD-10-CM | POA: Diagnosis not present

## 2021-01-30 DIAGNOSIS — E559 Vitamin D deficiency, unspecified: Secondary | ICD-10-CM | POA: Diagnosis not present

## 2021-02-21 DIAGNOSIS — M542 Cervicalgia: Secondary | ICD-10-CM | POA: Diagnosis not present

## 2021-02-21 DIAGNOSIS — G8929 Other chronic pain: Secondary | ICD-10-CM | POA: Diagnosis not present

## 2021-02-21 DIAGNOSIS — Z79899 Other long term (current) drug therapy: Secondary | ICD-10-CM | POA: Diagnosis not present

## 2021-02-21 DIAGNOSIS — M25561 Pain in right knee: Secondary | ICD-10-CM | POA: Diagnosis not present

## 2021-02-21 DIAGNOSIS — Z9889 Other specified postprocedural states: Secondary | ICD-10-CM | POA: Diagnosis not present

## 2021-03-10 DIAGNOSIS — R030 Elevated blood-pressure reading, without diagnosis of hypertension: Secondary | ICD-10-CM | POA: Diagnosis not present

## 2021-03-10 DIAGNOSIS — R9431 Abnormal electrocardiogram [ECG] [EKG]: Secondary | ICD-10-CM | POA: Diagnosis not present

## 2021-03-10 DIAGNOSIS — G43909 Migraine, unspecified, not intractable, without status migrainosus: Secondary | ICD-10-CM | POA: Diagnosis not present

## 2021-03-21 DIAGNOSIS — M25561 Pain in right knee: Secondary | ICD-10-CM | POA: Diagnosis not present

## 2021-03-21 DIAGNOSIS — R55 Syncope and collapse: Secondary | ICD-10-CM | POA: Diagnosis not present

## 2021-03-21 DIAGNOSIS — M542 Cervicalgia: Secondary | ICD-10-CM | POA: Diagnosis not present

## 2021-03-21 DIAGNOSIS — G8929 Other chronic pain: Secondary | ICD-10-CM | POA: Diagnosis not present

## 2021-03-21 DIAGNOSIS — Z9889 Other specified postprocedural states: Secondary | ICD-10-CM | POA: Diagnosis not present

## 2021-03-21 DIAGNOSIS — Z79899 Other long term (current) drug therapy: Secondary | ICD-10-CM | POA: Diagnosis not present

## 2021-04-15 DIAGNOSIS — R739 Hyperglycemia, unspecified: Secondary | ICD-10-CM | POA: Diagnosis not present

## 2021-04-15 DIAGNOSIS — Z79899 Other long term (current) drug therapy: Secondary | ICD-10-CM | POA: Diagnosis not present

## 2021-04-15 DIAGNOSIS — R030 Elevated blood-pressure reading, without diagnosis of hypertension: Secondary | ICD-10-CM | POA: Diagnosis not present

## 2021-04-15 DIAGNOSIS — E559 Vitamin D deficiency, unspecified: Secondary | ICD-10-CM | POA: Diagnosis not present

## 2021-04-15 DIAGNOSIS — D539 Nutritional anemia, unspecified: Secondary | ICD-10-CM | POA: Diagnosis not present

## 2021-04-15 DIAGNOSIS — E782 Mixed hyperlipidemia: Secondary | ICD-10-CM | POA: Diagnosis not present

## 2021-04-15 DIAGNOSIS — G43909 Migraine, unspecified, not intractable, without status migrainosus: Secondary | ICD-10-CM | POA: Diagnosis not present

## 2021-04-21 DIAGNOSIS — M25561 Pain in right knee: Secondary | ICD-10-CM | POA: Diagnosis not present

## 2021-04-21 DIAGNOSIS — G8929 Other chronic pain: Secondary | ICD-10-CM | POA: Diagnosis not present

## 2021-04-21 DIAGNOSIS — M25512 Pain in left shoulder: Secondary | ICD-10-CM | POA: Diagnosis not present

## 2021-04-21 DIAGNOSIS — M542 Cervicalgia: Secondary | ICD-10-CM | POA: Diagnosis not present

## 2021-04-21 DIAGNOSIS — Z79899 Other long term (current) drug therapy: Secondary | ICD-10-CM | POA: Diagnosis not present

## 2021-05-15 DIAGNOSIS — R739 Hyperglycemia, unspecified: Secondary | ICD-10-CM | POA: Diagnosis not present

## 2021-05-15 DIAGNOSIS — G43909 Migraine, unspecified, not intractable, without status migrainosus: Secondary | ICD-10-CM | POA: Diagnosis not present

## 2021-05-15 DIAGNOSIS — Z79899 Other long term (current) drug therapy: Secondary | ICD-10-CM | POA: Diagnosis not present

## 2021-05-19 DIAGNOSIS — G8929 Other chronic pain: Secondary | ICD-10-CM | POA: Diagnosis not present

## 2021-05-19 DIAGNOSIS — R300 Dysuria: Secondary | ICD-10-CM | POA: Diagnosis not present

## 2021-05-19 DIAGNOSIS — M542 Cervicalgia: Secondary | ICD-10-CM | POA: Diagnosis not present

## 2021-05-19 DIAGNOSIS — M25512 Pain in left shoulder: Secondary | ICD-10-CM | POA: Diagnosis not present

## 2021-05-19 DIAGNOSIS — M25561 Pain in right knee: Secondary | ICD-10-CM | POA: Diagnosis not present

## 2021-05-19 DIAGNOSIS — Z79899 Other long term (current) drug therapy: Secondary | ICD-10-CM | POA: Diagnosis not present

## 2021-05-19 DIAGNOSIS — N390 Urinary tract infection, site not specified: Secondary | ICD-10-CM | POA: Diagnosis not present

## 2021-05-22 DIAGNOSIS — Z79899 Other long term (current) drug therapy: Secondary | ICD-10-CM | POA: Diagnosis not present

## 2021-06-17 DIAGNOSIS — R739 Hyperglycemia, unspecified: Secondary | ICD-10-CM | POA: Diagnosis not present

## 2021-06-17 DIAGNOSIS — E538 Deficiency of other specified B group vitamins: Secondary | ICD-10-CM | POA: Diagnosis not present

## 2021-06-17 DIAGNOSIS — Z79899 Other long term (current) drug therapy: Secondary | ICD-10-CM | POA: Diagnosis not present

## 2021-06-17 DIAGNOSIS — G43909 Migraine, unspecified, not intractable, without status migrainosus: Secondary | ICD-10-CM | POA: Diagnosis not present

## 2021-06-19 DIAGNOSIS — G8929 Other chronic pain: Secondary | ICD-10-CM | POA: Diagnosis not present

## 2021-06-19 DIAGNOSIS — M542 Cervicalgia: Secondary | ICD-10-CM | POA: Diagnosis not present

## 2021-06-19 DIAGNOSIS — Z79899 Other long term (current) drug therapy: Secondary | ICD-10-CM | POA: Diagnosis not present

## 2021-06-19 DIAGNOSIS — Z9889 Other specified postprocedural states: Secondary | ICD-10-CM | POA: Diagnosis not present

## 2021-06-19 DIAGNOSIS — M25512 Pain in left shoulder: Secondary | ICD-10-CM | POA: Diagnosis not present

## 2021-06-19 DIAGNOSIS — M25561 Pain in right knee: Secondary | ICD-10-CM | POA: Diagnosis not present

## 2021-06-23 DIAGNOSIS — Z79899 Other long term (current) drug therapy: Secondary | ICD-10-CM | POA: Diagnosis not present

## 2021-07-15 DIAGNOSIS — E782 Mixed hyperlipidemia: Secondary | ICD-10-CM | POA: Diagnosis not present

## 2021-07-15 DIAGNOSIS — G43909 Migraine, unspecified, not intractable, without status migrainosus: Secondary | ICD-10-CM | POA: Diagnosis not present

## 2021-07-15 DIAGNOSIS — E538 Deficiency of other specified B group vitamins: Secondary | ICD-10-CM | POA: Diagnosis not present

## 2021-07-15 DIAGNOSIS — R739 Hyperglycemia, unspecified: Secondary | ICD-10-CM | POA: Diagnosis not present

## 2021-07-21 DIAGNOSIS — M25561 Pain in right knee: Secondary | ICD-10-CM | POA: Diagnosis not present

## 2021-07-21 DIAGNOSIS — M545 Low back pain, unspecified: Secondary | ICD-10-CM | POA: Diagnosis not present

## 2021-07-21 DIAGNOSIS — M25512 Pain in left shoulder: Secondary | ICD-10-CM | POA: Diagnosis not present

## 2021-07-21 DIAGNOSIS — Z79899 Other long term (current) drug therapy: Secondary | ICD-10-CM | POA: Diagnosis not present

## 2021-07-21 DIAGNOSIS — M542 Cervicalgia: Secondary | ICD-10-CM | POA: Diagnosis not present

## 2021-07-21 DIAGNOSIS — G8929 Other chronic pain: Secondary | ICD-10-CM | POA: Diagnosis not present

## 2021-07-23 DIAGNOSIS — Z79899 Other long term (current) drug therapy: Secondary | ICD-10-CM | POA: Diagnosis not present

## 2021-07-31 DIAGNOSIS — E559 Vitamin D deficiency, unspecified: Secondary | ICD-10-CM | POA: Diagnosis not present

## 2021-07-31 DIAGNOSIS — K219 Gastro-esophageal reflux disease without esophagitis: Secondary | ICD-10-CM | POA: Diagnosis not present

## 2021-07-31 DIAGNOSIS — J309 Allergic rhinitis, unspecified: Secondary | ICD-10-CM | POA: Diagnosis not present

## 2021-07-31 DIAGNOSIS — Z0000 Encounter for general adult medical examination without abnormal findings: Secondary | ICD-10-CM | POA: Diagnosis not present

## 2021-07-31 DIAGNOSIS — Z79899 Other long term (current) drug therapy: Secondary | ICD-10-CM | POA: Diagnosis not present

## 2021-07-31 DIAGNOSIS — Z23 Encounter for immunization: Secondary | ICD-10-CM | POA: Diagnosis not present

## 2021-07-31 DIAGNOSIS — E782 Mixed hyperlipidemia: Secondary | ICD-10-CM | POA: Diagnosis not present

## 2021-08-14 DIAGNOSIS — R739 Hyperglycemia, unspecified: Secondary | ICD-10-CM | POA: Diagnosis not present

## 2021-08-14 DIAGNOSIS — G43909 Migraine, unspecified, not intractable, without status migrainosus: Secondary | ICD-10-CM | POA: Diagnosis not present

## 2021-08-14 DIAGNOSIS — E782 Mixed hyperlipidemia: Secondary | ICD-10-CM | POA: Diagnosis not present

## 2021-08-14 DIAGNOSIS — E538 Deficiency of other specified B group vitamins: Secondary | ICD-10-CM | POA: Diagnosis not present

## 2021-08-22 DIAGNOSIS — M25512 Pain in left shoulder: Secondary | ICD-10-CM | POA: Diagnosis not present

## 2021-08-22 DIAGNOSIS — M542 Cervicalgia: Secondary | ICD-10-CM | POA: Diagnosis not present

## 2021-08-22 DIAGNOSIS — G8929 Other chronic pain: Secondary | ICD-10-CM | POA: Diagnosis not present

## 2021-08-22 DIAGNOSIS — M25561 Pain in right knee: Secondary | ICD-10-CM | POA: Diagnosis not present

## 2021-08-22 DIAGNOSIS — Z79899 Other long term (current) drug therapy: Secondary | ICD-10-CM | POA: Diagnosis not present

## 2021-09-11 DIAGNOSIS — E782 Mixed hyperlipidemia: Secondary | ICD-10-CM | POA: Diagnosis not present

## 2021-09-11 DIAGNOSIS — Z0000 Encounter for general adult medical examination without abnormal findings: Secondary | ICD-10-CM | POA: Diagnosis not present

## 2021-09-11 DIAGNOSIS — E781 Pure hyperglyceridemia: Secondary | ICD-10-CM | POA: Diagnosis not present

## 2021-09-11 DIAGNOSIS — E538 Deficiency of other specified B group vitamins: Secondary | ICD-10-CM | POA: Diagnosis not present

## 2021-09-11 DIAGNOSIS — R739 Hyperglycemia, unspecified: Secondary | ICD-10-CM | POA: Diagnosis not present

## 2021-10-01 DIAGNOSIS — Z79899 Other long term (current) drug therapy: Secondary | ICD-10-CM | POA: Diagnosis not present

## 2021-10-01 DIAGNOSIS — G8929 Other chronic pain: Secondary | ICD-10-CM | POA: Diagnosis not present

## 2021-10-01 DIAGNOSIS — M25512 Pain in left shoulder: Secondary | ICD-10-CM | POA: Diagnosis not present

## 2021-10-01 DIAGNOSIS — M542 Cervicalgia: Secondary | ICD-10-CM | POA: Diagnosis not present

## 2021-10-01 DIAGNOSIS — M25561 Pain in right knee: Secondary | ICD-10-CM | POA: Diagnosis not present

## 2021-10-03 DIAGNOSIS — Z79899 Other long term (current) drug therapy: Secondary | ICD-10-CM | POA: Diagnosis not present

## 2021-10-09 DIAGNOSIS — R739 Hyperglycemia, unspecified: Secondary | ICD-10-CM | POA: Diagnosis not present

## 2021-10-17 DIAGNOSIS — R197 Diarrhea, unspecified: Secondary | ICD-10-CM | POA: Diagnosis not present

## 2021-10-17 DIAGNOSIS — H6693 Otitis media, unspecified, bilateral: Secondary | ICD-10-CM | POA: Diagnosis not present

## 2021-10-17 DIAGNOSIS — R051 Acute cough: Secondary | ICD-10-CM | POA: Diagnosis not present

## 2021-10-17 DIAGNOSIS — Z1152 Encounter for screening for COVID-19: Secondary | ICD-10-CM | POA: Diagnosis not present

## 2021-10-17 DIAGNOSIS — R110 Nausea: Secondary | ICD-10-CM | POA: Diagnosis not present

## 2021-10-17 DIAGNOSIS — Z03818 Encounter for observation for suspected exposure to other biological agents ruled out: Secondary | ICD-10-CM | POA: Diagnosis not present

## 2021-10-29 DIAGNOSIS — M25512 Pain in left shoulder: Secondary | ICD-10-CM | POA: Diagnosis not present

## 2021-10-29 DIAGNOSIS — E876 Hypokalemia: Secondary | ICD-10-CM | POA: Diagnosis not present

## 2021-10-29 DIAGNOSIS — G8929 Other chronic pain: Secondary | ICD-10-CM | POA: Diagnosis not present

## 2021-10-29 DIAGNOSIS — M542 Cervicalgia: Secondary | ICD-10-CM | POA: Diagnosis not present

## 2021-10-29 DIAGNOSIS — M25561 Pain in right knee: Secondary | ICD-10-CM | POA: Diagnosis not present

## 2021-10-29 DIAGNOSIS — Z79899 Other long term (current) drug therapy: Secondary | ICD-10-CM | POA: Diagnosis not present

## 2021-11-06 DIAGNOSIS — E781 Pure hyperglyceridemia: Secondary | ICD-10-CM | POA: Diagnosis not present

## 2021-11-06 DIAGNOSIS — R739 Hyperglycemia, unspecified: Secondary | ICD-10-CM | POA: Diagnosis not present

## 2021-11-22 DIAGNOSIS — H6693 Otitis media, unspecified, bilateral: Secondary | ICD-10-CM | POA: Diagnosis not present

## 2021-11-26 DIAGNOSIS — Z9889 Other specified postprocedural states: Secondary | ICD-10-CM | POA: Diagnosis not present

## 2021-11-26 DIAGNOSIS — M542 Cervicalgia: Secondary | ICD-10-CM | POA: Diagnosis not present

## 2021-11-26 DIAGNOSIS — G8929 Other chronic pain: Secondary | ICD-10-CM | POA: Diagnosis not present

## 2021-11-26 DIAGNOSIS — M25512 Pain in left shoulder: Secondary | ICD-10-CM | POA: Diagnosis not present

## 2021-11-26 DIAGNOSIS — Z79899 Other long term (current) drug therapy: Secondary | ICD-10-CM | POA: Diagnosis not present

## 2021-11-26 DIAGNOSIS — M25561 Pain in right knee: Secondary | ICD-10-CM | POA: Diagnosis not present

## 2021-11-28 DIAGNOSIS — Z79899 Other long term (current) drug therapy: Secondary | ICD-10-CM | POA: Diagnosis not present

## 2021-12-10 DIAGNOSIS — E8881 Metabolic syndrome: Secondary | ICD-10-CM | POA: Diagnosis not present

## 2021-12-10 DIAGNOSIS — R739 Hyperglycemia, unspecified: Secondary | ICD-10-CM | POA: Diagnosis not present

## 2021-12-10 DIAGNOSIS — E781 Pure hyperglyceridemia: Secondary | ICD-10-CM | POA: Diagnosis not present

## 2021-12-10 DIAGNOSIS — Z0000 Encounter for general adult medical examination without abnormal findings: Secondary | ICD-10-CM | POA: Diagnosis not present

## 2021-12-16 DIAGNOSIS — R159 Full incontinence of feces: Secondary | ICD-10-CM | POA: Diagnosis not present

## 2021-12-16 DIAGNOSIS — Z800 Family history of malignant neoplasm of digestive organs: Secondary | ICD-10-CM | POA: Diagnosis not present

## 2021-12-16 DIAGNOSIS — R1084 Generalized abdominal pain: Secondary | ICD-10-CM | POA: Diagnosis not present

## 2021-12-16 DIAGNOSIS — K529 Noninfective gastroenteritis and colitis, unspecified: Secondary | ICD-10-CM | POA: Diagnosis not present

## 2021-12-17 DIAGNOSIS — M47812 Spondylosis without myelopathy or radiculopathy, cervical region: Secondary | ICD-10-CM | POA: Diagnosis not present

## 2021-12-31 DIAGNOSIS — K529 Noninfective gastroenteritis and colitis, unspecified: Secondary | ICD-10-CM | POA: Diagnosis not present

## 2022-01-05 DIAGNOSIS — Y92410 Unspecified street and highway as the place of occurrence of the external cause: Secondary | ICD-10-CM | POA: Diagnosis not present

## 2022-01-05 DIAGNOSIS — R0789 Other chest pain: Secondary | ICD-10-CM | POA: Diagnosis not present

## 2022-01-05 DIAGNOSIS — Z886 Allergy status to analgesic agent status: Secondary | ICD-10-CM | POA: Diagnosis not present

## 2022-01-05 DIAGNOSIS — G8911 Acute pain due to trauma: Secondary | ICD-10-CM | POA: Diagnosis not present

## 2022-01-05 DIAGNOSIS — Z9889 Other specified postprocedural states: Secondary | ICD-10-CM | POA: Diagnosis not present

## 2022-01-05 DIAGNOSIS — Z79899 Other long term (current) drug therapy: Secondary | ICD-10-CM | POA: Diagnosis not present

## 2022-01-05 DIAGNOSIS — S4992XA Unspecified injury of left shoulder and upper arm, initial encounter: Secondary | ICD-10-CM | POA: Diagnosis not present

## 2022-01-05 DIAGNOSIS — M4802 Spinal stenosis, cervical region: Secondary | ICD-10-CM | POA: Diagnosis not present

## 2022-01-05 DIAGNOSIS — F4310 Post-traumatic stress disorder, unspecified: Secondary | ICD-10-CM | POA: Diagnosis not present

## 2022-01-05 DIAGNOSIS — I10 Essential (primary) hypertension: Secondary | ICD-10-CM | POA: Diagnosis not present

## 2022-01-05 DIAGNOSIS — M79602 Pain in left arm: Secondary | ICD-10-CM | POA: Diagnosis not present

## 2022-01-05 DIAGNOSIS — M791 Myalgia, unspecified site: Secondary | ICD-10-CM | POA: Diagnosis not present

## 2022-01-05 DIAGNOSIS — M25561 Pain in right knee: Secondary | ICD-10-CM | POA: Diagnosis not present

## 2022-01-05 DIAGNOSIS — Z885 Allergy status to narcotic agent status: Secondary | ICD-10-CM | POA: Diagnosis not present

## 2022-01-05 DIAGNOSIS — Z9049 Acquired absence of other specified parts of digestive tract: Secondary | ICD-10-CM | POA: Diagnosis not present

## 2022-01-05 DIAGNOSIS — M542 Cervicalgia: Secondary | ICD-10-CM | POA: Diagnosis not present

## 2022-01-05 DIAGNOSIS — G8929 Other chronic pain: Secondary | ICD-10-CM | POA: Diagnosis not present

## 2022-01-05 DIAGNOSIS — Z888 Allergy status to other drugs, medicaments and biological substances status: Secondary | ICD-10-CM | POA: Diagnosis not present

## 2022-01-05 DIAGNOSIS — S199XXA Unspecified injury of neck, initial encounter: Secondary | ICD-10-CM | POA: Diagnosis not present

## 2022-01-05 DIAGNOSIS — R69 Illness, unspecified: Secondary | ICD-10-CM | POA: Diagnosis not present

## 2022-01-05 DIAGNOSIS — M25512 Pain in left shoulder: Secondary | ICD-10-CM | POA: Diagnosis not present

## 2022-01-08 DIAGNOSIS — K635 Polyp of colon: Secondary | ICD-10-CM | POA: Diagnosis not present

## 2022-01-20 DIAGNOSIS — M5412 Radiculopathy, cervical region: Secondary | ICD-10-CM | POA: Diagnosis not present

## 2022-01-23 ENCOUNTER — Encounter (HOSPITAL_COMMUNITY): Payer: Self-pay | Admitting: *Deleted

## 2022-02-02 DIAGNOSIS — M5412 Radiculopathy, cervical region: Secondary | ICD-10-CM | POA: Diagnosis not present

## 2022-02-04 DIAGNOSIS — M25561 Pain in right knee: Secondary | ICD-10-CM | POA: Diagnosis not present

## 2022-02-04 DIAGNOSIS — M4802 Spinal stenosis, cervical region: Secondary | ICD-10-CM | POA: Diagnosis not present

## 2022-02-04 DIAGNOSIS — Z79899 Other long term (current) drug therapy: Secondary | ICD-10-CM | POA: Diagnosis not present

## 2022-02-04 DIAGNOSIS — G8929 Other chronic pain: Secondary | ICD-10-CM | POA: Diagnosis not present

## 2022-02-04 DIAGNOSIS — M542 Cervicalgia: Secondary | ICD-10-CM | POA: Diagnosis not present

## 2022-02-12 DIAGNOSIS — L03114 Cellulitis of left upper limb: Secondary | ICD-10-CM | POA: Diagnosis not present

## 2022-02-12 DIAGNOSIS — M47812 Spondylosis without myelopathy or radiculopathy, cervical region: Secondary | ICD-10-CM | POA: Diagnosis not present

## 2022-02-12 DIAGNOSIS — Z6835 Body mass index (BMI) 35.0-35.9, adult: Secondary | ICD-10-CM | POA: Diagnosis not present

## 2022-02-12 DIAGNOSIS — M4802 Spinal stenosis, cervical region: Secondary | ICD-10-CM | POA: Diagnosis not present

## 2022-02-19 DIAGNOSIS — R69 Illness, unspecified: Secondary | ICD-10-CM | POA: Diagnosis not present

## 2022-02-19 DIAGNOSIS — M1712 Unilateral primary osteoarthritis, left knee: Secondary | ICD-10-CM | POA: Diagnosis not present

## 2022-03-09 DIAGNOSIS — N390 Urinary tract infection, site not specified: Secondary | ICD-10-CM | POA: Diagnosis not present

## 2022-03-09 DIAGNOSIS — M4802 Spinal stenosis, cervical region: Secondary | ICD-10-CM | POA: Diagnosis not present

## 2022-03-09 DIAGNOSIS — G8929 Other chronic pain: Secondary | ICD-10-CM | POA: Diagnosis not present

## 2022-03-09 DIAGNOSIS — R300 Dysuria: Secondary | ICD-10-CM | POA: Diagnosis not present

## 2022-03-09 DIAGNOSIS — M25561 Pain in right knee: Secondary | ICD-10-CM | POA: Diagnosis not present

## 2022-03-09 DIAGNOSIS — M542 Cervicalgia: Secondary | ICD-10-CM | POA: Diagnosis not present

## 2022-03-09 DIAGNOSIS — Z79899 Other long term (current) drug therapy: Secondary | ICD-10-CM | POA: Diagnosis not present

## 2022-04-08 DIAGNOSIS — M542 Cervicalgia: Secondary | ICD-10-CM | POA: Diagnosis not present

## 2022-04-08 DIAGNOSIS — G8929 Other chronic pain: Secondary | ICD-10-CM | POA: Diagnosis not present

## 2022-04-08 DIAGNOSIS — N390 Urinary tract infection, site not specified: Secondary | ICD-10-CM | POA: Diagnosis not present

## 2022-04-08 DIAGNOSIS — R300 Dysuria: Secondary | ICD-10-CM | POA: Diagnosis not present

## 2022-04-08 DIAGNOSIS — M4802 Spinal stenosis, cervical region: Secondary | ICD-10-CM | POA: Diagnosis not present

## 2022-04-08 DIAGNOSIS — M25561 Pain in right knee: Secondary | ICD-10-CM | POA: Diagnosis not present

## 2022-04-08 DIAGNOSIS — Z79899 Other long term (current) drug therapy: Secondary | ICD-10-CM | POA: Diagnosis not present

## 2022-04-20 DIAGNOSIS — H8113 Benign paroxysmal vertigo, bilateral: Secondary | ICD-10-CM | POA: Diagnosis not present

## 2022-04-30 DIAGNOSIS — N3000 Acute cystitis without hematuria: Secondary | ICD-10-CM | POA: Diagnosis not present

## 2022-04-30 DIAGNOSIS — N3090 Cystitis, unspecified without hematuria: Secondary | ICD-10-CM | POA: Diagnosis not present

## 2022-04-30 DIAGNOSIS — Z79899 Other long term (current) drug therapy: Secondary | ICD-10-CM | POA: Diagnosis not present

## 2022-04-30 DIAGNOSIS — I10 Essential (primary) hypertension: Secondary | ICD-10-CM | POA: Diagnosis not present

## 2022-04-30 DIAGNOSIS — R1031 Right lower quadrant pain: Secondary | ICD-10-CM | POA: Diagnosis not present

## 2022-04-30 DIAGNOSIS — Z888 Allergy status to other drugs, medicaments and biological substances status: Secondary | ICD-10-CM | POA: Diagnosis not present

## 2022-04-30 DIAGNOSIS — Z9049 Acquired absence of other specified parts of digestive tract: Secondary | ICD-10-CM | POA: Diagnosis not present

## 2022-04-30 DIAGNOSIS — R350 Frequency of micturition: Secondary | ICD-10-CM | POA: Diagnosis not present

## 2022-04-30 DIAGNOSIS — R197 Diarrhea, unspecified: Secondary | ICD-10-CM | POA: Diagnosis not present

## 2022-04-30 DIAGNOSIS — K5730 Diverticulosis of large intestine without perforation or abscess without bleeding: Secondary | ICD-10-CM | POA: Diagnosis not present

## 2022-04-30 DIAGNOSIS — Z90710 Acquired absence of both cervix and uterus: Secondary | ICD-10-CM | POA: Diagnosis not present

## 2022-04-30 DIAGNOSIS — R300 Dysuria: Secondary | ICD-10-CM | POA: Diagnosis not present

## 2022-04-30 DIAGNOSIS — Z885 Allergy status to narcotic agent status: Secondary | ICD-10-CM | POA: Diagnosis not present

## 2022-04-30 DIAGNOSIS — Z886 Allergy status to analgesic agent status: Secondary | ICD-10-CM | POA: Diagnosis not present

## 2022-04-30 DIAGNOSIS — R42 Dizziness and giddiness: Secondary | ICD-10-CM | POA: Diagnosis not present

## 2022-05-15 DIAGNOSIS — M542 Cervicalgia: Secondary | ICD-10-CM | POA: Diagnosis not present

## 2022-05-15 DIAGNOSIS — M4802 Spinal stenosis, cervical region: Secondary | ICD-10-CM | POA: Diagnosis not present

## 2022-05-15 DIAGNOSIS — R42 Dizziness and giddiness: Secondary | ICD-10-CM | POA: Diagnosis not present

## 2022-05-15 DIAGNOSIS — M25561 Pain in right knee: Secondary | ICD-10-CM | POA: Diagnosis not present

## 2022-05-15 DIAGNOSIS — Z6837 Body mass index (BMI) 37.0-37.9, adult: Secondary | ICD-10-CM | POA: Diagnosis not present

## 2022-05-15 DIAGNOSIS — M25512 Pain in left shoulder: Secondary | ICD-10-CM | POA: Diagnosis not present

## 2022-05-15 DIAGNOSIS — M47812 Spondylosis without myelopathy or radiculopathy, cervical region: Secondary | ICD-10-CM | POA: Diagnosis not present

## 2022-05-15 DIAGNOSIS — Z9889 Other specified postprocedural states: Secondary | ICD-10-CM | POA: Diagnosis not present

## 2022-05-15 DIAGNOSIS — Z79899 Other long term (current) drug therapy: Secondary | ICD-10-CM | POA: Diagnosis not present

## 2022-05-15 DIAGNOSIS — R69 Illness, unspecified: Secondary | ICD-10-CM | POA: Diagnosis not present

## 2022-06-15 DIAGNOSIS — M47812 Spondylosis without myelopathy or radiculopathy, cervical region: Secondary | ICD-10-CM | POA: Diagnosis not present

## 2022-06-15 DIAGNOSIS — Z9889 Other specified postprocedural states: Secondary | ICD-10-CM | POA: Diagnosis not present

## 2022-06-15 DIAGNOSIS — R42 Dizziness and giddiness: Secondary | ICD-10-CM | POA: Diagnosis not present

## 2022-06-15 DIAGNOSIS — M542 Cervicalgia: Secondary | ICD-10-CM | POA: Diagnosis not present

## 2022-06-15 DIAGNOSIS — Z6838 Body mass index (BMI) 38.0-38.9, adult: Secondary | ICD-10-CM | POA: Diagnosis not present

## 2022-06-15 DIAGNOSIS — G8929 Other chronic pain: Secondary | ICD-10-CM | POA: Diagnosis not present

## 2022-06-15 DIAGNOSIS — M25512 Pain in left shoulder: Secondary | ICD-10-CM | POA: Diagnosis not present

## 2022-06-15 DIAGNOSIS — Z79899 Other long term (current) drug therapy: Secondary | ICD-10-CM | POA: Diagnosis not present

## 2022-06-15 DIAGNOSIS — M4802 Spinal stenosis, cervical region: Secondary | ICD-10-CM | POA: Diagnosis not present

## 2022-06-15 DIAGNOSIS — M25561 Pain in right knee: Secondary | ICD-10-CM | POA: Diagnosis not present

## 2022-06-20 DIAGNOSIS — M25562 Pain in left knee: Secondary | ICD-10-CM | POA: Diagnosis not present

## 2022-06-20 DIAGNOSIS — L089 Local infection of the skin and subcutaneous tissue, unspecified: Secondary | ICD-10-CM | POA: Diagnosis not present

## 2022-06-20 DIAGNOSIS — Z6839 Body mass index (BMI) 39.0-39.9, adult: Secondary | ICD-10-CM | POA: Diagnosis not present

## 2022-06-20 DIAGNOSIS — G8929 Other chronic pain: Secondary | ICD-10-CM | POA: Diagnosis not present

## 2022-07-01 DIAGNOSIS — K449 Diaphragmatic hernia without obstruction or gangrene: Secondary | ICD-10-CM | POA: Diagnosis not present

## 2022-07-01 DIAGNOSIS — K5732 Diverticulitis of large intestine without perforation or abscess without bleeding: Secondary | ICD-10-CM | POA: Diagnosis not present

## 2022-07-01 DIAGNOSIS — R69 Illness, unspecified: Secondary | ICD-10-CM | POA: Diagnosis not present

## 2022-07-01 DIAGNOSIS — K6389 Other specified diseases of intestine: Secondary | ICD-10-CM | POA: Diagnosis not present

## 2022-07-01 DIAGNOSIS — K5792 Diverticulitis of intestine, part unspecified, without perforation or abscess without bleeding: Secondary | ICD-10-CM | POA: Diagnosis not present

## 2022-07-01 DIAGNOSIS — R000 Tachycardia, unspecified: Secondary | ICD-10-CM | POA: Diagnosis not present

## 2022-07-01 DIAGNOSIS — R509 Fever, unspecified: Secondary | ICD-10-CM | POA: Diagnosis not present

## 2022-07-01 DIAGNOSIS — R1032 Left lower quadrant pain: Secondary | ICD-10-CM | POA: Diagnosis not present

## 2022-07-01 DIAGNOSIS — A419 Sepsis, unspecified organism: Secondary | ICD-10-CM | POA: Diagnosis not present

## 2022-07-01 DIAGNOSIS — G8921 Chronic pain due to trauma: Secondary | ICD-10-CM | POA: Diagnosis not present

## 2022-07-01 DIAGNOSIS — Z903 Acquired absence of stomach [part of]: Secondary | ICD-10-CM | POA: Diagnosis not present

## 2022-07-02 DIAGNOSIS — R197 Diarrhea, unspecified: Secondary | ICD-10-CM | POA: Diagnosis not present

## 2022-07-02 DIAGNOSIS — K5900 Constipation, unspecified: Secondary | ICD-10-CM | POA: Diagnosis not present

## 2022-07-02 DIAGNOSIS — R69 Illness, unspecified: Secondary | ICD-10-CM | POA: Diagnosis not present

## 2022-07-02 DIAGNOSIS — R000 Tachycardia, unspecified: Secondary | ICD-10-CM | POA: Diagnosis not present

## 2022-07-02 DIAGNOSIS — Z79899 Other long term (current) drug therapy: Secondary | ICD-10-CM | POA: Diagnosis not present

## 2022-07-02 DIAGNOSIS — M79605 Pain in left leg: Secondary | ICD-10-CM | POA: Diagnosis not present

## 2022-07-02 DIAGNOSIS — R1032 Left lower quadrant pain: Secondary | ICD-10-CM | POA: Diagnosis not present

## 2022-07-02 DIAGNOSIS — Z98891 History of uterine scar from previous surgery: Secondary | ICD-10-CM | POA: Diagnosis not present

## 2022-07-02 DIAGNOSIS — Z888 Allergy status to other drugs, medicaments and biological substances status: Secondary | ICD-10-CM | POA: Diagnosis not present

## 2022-07-02 DIAGNOSIS — K449 Diaphragmatic hernia without obstruction or gangrene: Secondary | ICD-10-CM | POA: Diagnosis not present

## 2022-07-02 DIAGNOSIS — Z9851 Tubal ligation status: Secondary | ICD-10-CM | POA: Diagnosis not present

## 2022-07-02 DIAGNOSIS — Z90710 Acquired absence of both cervix and uterus: Secondary | ICD-10-CM | POA: Diagnosis not present

## 2022-07-02 DIAGNOSIS — Z903 Acquired absence of stomach [part of]: Secondary | ICD-10-CM | POA: Diagnosis not present

## 2022-07-02 DIAGNOSIS — R6520 Severe sepsis without septic shock: Secondary | ICD-10-CM | POA: Diagnosis not present

## 2022-07-02 DIAGNOSIS — R350 Frequency of micturition: Secondary | ICD-10-CM | POA: Diagnosis not present

## 2022-07-02 DIAGNOSIS — Z9049 Acquired absence of other specified parts of digestive tract: Secondary | ICD-10-CM | POA: Diagnosis not present

## 2022-07-02 DIAGNOSIS — M79604 Pain in right leg: Secondary | ICD-10-CM | POA: Diagnosis not present

## 2022-07-02 DIAGNOSIS — Z6839 Body mass index (BMI) 39.0-39.9, adult: Secondary | ICD-10-CM | POA: Diagnosis not present

## 2022-07-02 DIAGNOSIS — R109 Unspecified abdominal pain: Secondary | ICD-10-CM | POA: Diagnosis not present

## 2022-07-02 DIAGNOSIS — I517 Cardiomegaly: Secondary | ICD-10-CM | POA: Diagnosis not present

## 2022-07-02 DIAGNOSIS — H8110 Benign paroxysmal vertigo, unspecified ear: Secondary | ICD-10-CM | POA: Diagnosis not present

## 2022-07-02 DIAGNOSIS — R0600 Dyspnea, unspecified: Secondary | ICD-10-CM | POA: Diagnosis not present

## 2022-07-02 DIAGNOSIS — Z886 Allergy status to analgesic agent status: Secondary | ICD-10-CM | POA: Diagnosis not present

## 2022-07-02 DIAGNOSIS — K5732 Diverticulitis of large intestine without perforation or abscess without bleeding: Secondary | ICD-10-CM | POA: Diagnosis not present

## 2022-07-02 DIAGNOSIS — Z79891 Long term (current) use of opiate analgesic: Secondary | ICD-10-CM | POA: Diagnosis not present

## 2022-07-02 DIAGNOSIS — A419 Sepsis, unspecified organism: Secondary | ICD-10-CM | POA: Diagnosis not present

## 2022-07-02 DIAGNOSIS — R509 Fever, unspecified: Secondary | ICD-10-CM | POA: Diagnosis not present

## 2022-07-02 DIAGNOSIS — K429 Umbilical hernia without obstruction or gangrene: Secondary | ICD-10-CM | POA: Diagnosis not present

## 2022-07-02 DIAGNOSIS — K5792 Diverticulitis of intestine, part unspecified, without perforation or abscess without bleeding: Secondary | ICD-10-CM | POA: Diagnosis not present

## 2022-07-02 DIAGNOSIS — Z9884 Bariatric surgery status: Secondary | ICD-10-CM | POA: Diagnosis not present

## 2022-07-02 DIAGNOSIS — I10 Essential (primary) hypertension: Secondary | ICD-10-CM | POA: Diagnosis not present

## 2022-07-02 DIAGNOSIS — R3915 Urgency of urination: Secondary | ICD-10-CM | POA: Diagnosis not present

## 2022-07-02 DIAGNOSIS — K6389 Other specified diseases of intestine: Secondary | ICD-10-CM | POA: Diagnosis not present

## 2022-07-02 DIAGNOSIS — G8921 Chronic pain due to trauma: Secondary | ICD-10-CM | POA: Diagnosis not present

## 2022-07-03 DIAGNOSIS — A419 Sepsis, unspecified organism: Secondary | ICD-10-CM | POA: Diagnosis not present

## 2022-07-03 DIAGNOSIS — R69 Illness, unspecified: Secondary | ICD-10-CM | POA: Diagnosis not present

## 2022-07-03 DIAGNOSIS — G8921 Chronic pain due to trauma: Secondary | ICD-10-CM | POA: Diagnosis not present

## 2022-07-03 DIAGNOSIS — K5792 Diverticulitis of intestine, part unspecified, without perforation or abscess without bleeding: Secondary | ICD-10-CM | POA: Diagnosis not present

## 2022-07-04 DIAGNOSIS — Z9049 Acquired absence of other specified parts of digestive tract: Secondary | ICD-10-CM | POA: Diagnosis not present

## 2022-07-04 DIAGNOSIS — R109 Unspecified abdominal pain: Secondary | ICD-10-CM | POA: Diagnosis not present

## 2022-07-05 DIAGNOSIS — Z9049 Acquired absence of other specified parts of digestive tract: Secondary | ICD-10-CM | POA: Diagnosis not present

## 2022-07-05 DIAGNOSIS — R69 Illness, unspecified: Secondary | ICD-10-CM | POA: Diagnosis not present

## 2022-07-05 DIAGNOSIS — K429 Umbilical hernia without obstruction or gangrene: Secondary | ICD-10-CM | POA: Diagnosis not present

## 2022-07-05 DIAGNOSIS — K5792 Diverticulitis of intestine, part unspecified, without perforation or abscess without bleeding: Secondary | ICD-10-CM | POA: Diagnosis not present

## 2022-07-05 DIAGNOSIS — K5900 Constipation, unspecified: Secondary | ICD-10-CM | POA: Diagnosis not present

## 2022-07-05 DIAGNOSIS — K5732 Diverticulitis of large intestine without perforation or abscess without bleeding: Secondary | ICD-10-CM | POA: Diagnosis not present

## 2022-07-05 DIAGNOSIS — G8921 Chronic pain due to trauma: Secondary | ICD-10-CM | POA: Diagnosis not present

## 2022-07-05 DIAGNOSIS — A419 Sepsis, unspecified organism: Secondary | ICD-10-CM | POA: Diagnosis not present

## 2022-07-05 DIAGNOSIS — M79604 Pain in right leg: Secondary | ICD-10-CM | POA: Diagnosis not present

## 2022-07-05 DIAGNOSIS — M79605 Pain in left leg: Secondary | ICD-10-CM | POA: Diagnosis not present

## 2022-07-06 DIAGNOSIS — I517 Cardiomegaly: Secondary | ICD-10-CM | POA: Diagnosis not present

## 2022-07-06 DIAGNOSIS — K5792 Diverticulitis of intestine, part unspecified, without perforation or abscess without bleeding: Secondary | ICD-10-CM | POA: Diagnosis not present

## 2022-07-06 DIAGNOSIS — R6520 Severe sepsis without septic shock: Secondary | ICD-10-CM | POA: Diagnosis not present

## 2022-07-06 DIAGNOSIS — A419 Sepsis, unspecified organism: Secondary | ICD-10-CM | POA: Diagnosis not present

## 2022-07-06 DIAGNOSIS — H8110 Benign paroxysmal vertigo, unspecified ear: Secondary | ICD-10-CM | POA: Diagnosis not present

## 2022-07-07 DIAGNOSIS — R6520 Severe sepsis without septic shock: Secondary | ICD-10-CM | POA: Diagnosis not present

## 2022-07-07 DIAGNOSIS — H8110 Benign paroxysmal vertigo, unspecified ear: Secondary | ICD-10-CM | POA: Diagnosis not present

## 2022-07-07 DIAGNOSIS — K5792 Diverticulitis of intestine, part unspecified, without perforation or abscess without bleeding: Secondary | ICD-10-CM | POA: Diagnosis not present

## 2022-07-07 DIAGNOSIS — A419 Sepsis, unspecified organism: Secondary | ICD-10-CM | POA: Diagnosis not present

## 2022-07-20 DIAGNOSIS — Z09 Encounter for follow-up examination after completed treatment for conditions other than malignant neoplasm: Secondary | ICD-10-CM | POA: Diagnosis not present

## 2022-07-20 DIAGNOSIS — M4802 Spinal stenosis, cervical region: Secondary | ICD-10-CM | POA: Diagnosis not present

## 2022-07-20 DIAGNOSIS — K5792 Diverticulitis of intestine, part unspecified, without perforation or abscess without bleeding: Secondary | ICD-10-CM | POA: Diagnosis not present

## 2022-07-20 DIAGNOSIS — Z23 Encounter for immunization: Secondary | ICD-10-CM | POA: Diagnosis not present

## 2022-07-20 DIAGNOSIS — M25512 Pain in left shoulder: Secondary | ICD-10-CM | POA: Diagnosis not present

## 2022-07-20 DIAGNOSIS — M47812 Spondylosis without myelopathy or radiculopathy, cervical region: Secondary | ICD-10-CM | POA: Diagnosis not present

## 2022-07-20 DIAGNOSIS — I2699 Other pulmonary embolism without acute cor pulmonale: Secondary | ICD-10-CM | POA: Diagnosis not present

## 2022-07-20 DIAGNOSIS — M542 Cervicalgia: Secondary | ICD-10-CM | POA: Diagnosis not present

## 2022-07-20 DIAGNOSIS — Z9889 Other specified postprocedural states: Secondary | ICD-10-CM | POA: Diagnosis not present

## 2022-07-20 DIAGNOSIS — M25561 Pain in right knee: Secondary | ICD-10-CM | POA: Diagnosis not present

## 2022-07-20 DIAGNOSIS — G8929 Other chronic pain: Secondary | ICD-10-CM | POA: Diagnosis not present

## 2022-07-20 DIAGNOSIS — Z79899 Other long term (current) drug therapy: Secondary | ICD-10-CM | POA: Diagnosis not present

## 2022-07-20 DIAGNOSIS — Z6839 Body mass index (BMI) 39.0-39.9, adult: Secondary | ICD-10-CM | POA: Diagnosis not present

## 2022-07-20 DIAGNOSIS — E669 Obesity, unspecified: Secondary | ICD-10-CM | POA: Diagnosis not present

## 2022-07-20 DIAGNOSIS — R42 Dizziness and giddiness: Secondary | ICD-10-CM | POA: Diagnosis not present

## 2022-07-23 DIAGNOSIS — R42 Dizziness and giddiness: Secondary | ICD-10-CM | POA: Diagnosis not present

## 2022-08-06 DIAGNOSIS — E782 Mixed hyperlipidemia: Secondary | ICD-10-CM | POA: Diagnosis not present

## 2022-08-06 DIAGNOSIS — R112 Nausea with vomiting, unspecified: Secondary | ICD-10-CM | POA: Diagnosis not present

## 2022-08-06 DIAGNOSIS — K219 Gastro-esophageal reflux disease without esophagitis: Secondary | ICD-10-CM | POA: Diagnosis not present

## 2022-08-06 DIAGNOSIS — E559 Vitamin D deficiency, unspecified: Secondary | ICD-10-CM | POA: Diagnosis not present

## 2022-08-06 DIAGNOSIS — R69 Illness, unspecified: Secondary | ICD-10-CM | POA: Diagnosis not present

## 2022-08-06 DIAGNOSIS — J309 Allergic rhinitis, unspecified: Secondary | ICD-10-CM | POA: Diagnosis not present

## 2022-08-06 DIAGNOSIS — Z79899 Other long term (current) drug therapy: Secondary | ICD-10-CM | POA: Diagnosis not present

## 2022-08-06 DIAGNOSIS — Z0000 Encounter for general adult medical examination without abnormal findings: Secondary | ICD-10-CM | POA: Diagnosis not present

## 2022-08-06 DIAGNOSIS — E876 Hypokalemia: Secondary | ICD-10-CM | POA: Diagnosis not present

## 2022-08-07 ENCOUNTER — Other Ambulatory Visit: Payer: Self-pay | Admitting: Family Medicine

## 2022-08-07 DIAGNOSIS — Z1231 Encounter for screening mammogram for malignant neoplasm of breast: Secondary | ICD-10-CM

## 2022-08-19 DIAGNOSIS — M542 Cervicalgia: Secondary | ICD-10-CM | POA: Diagnosis not present

## 2022-08-19 DIAGNOSIS — G8929 Other chronic pain: Secondary | ICD-10-CM | POA: Diagnosis not present

## 2022-08-19 DIAGNOSIS — M25512 Pain in left shoulder: Secondary | ICD-10-CM | POA: Diagnosis not present

## 2022-08-19 DIAGNOSIS — Z9889 Other specified postprocedural states: Secondary | ICD-10-CM | POA: Diagnosis not present

## 2022-08-19 DIAGNOSIS — Z6841 Body Mass Index (BMI) 40.0 and over, adult: Secondary | ICD-10-CM | POA: Diagnosis not present

## 2022-08-19 DIAGNOSIS — M4802 Spinal stenosis, cervical region: Secondary | ICD-10-CM | POA: Diagnosis not present

## 2022-08-19 DIAGNOSIS — Z79899 Other long term (current) drug therapy: Secondary | ICD-10-CM | POA: Diagnosis not present

## 2022-08-19 DIAGNOSIS — M47812 Spondylosis without myelopathy or radiculopathy, cervical region: Secondary | ICD-10-CM | POA: Diagnosis not present

## 2022-08-19 DIAGNOSIS — M25561 Pain in right knee: Secondary | ICD-10-CM | POA: Diagnosis not present

## 2022-09-18 DIAGNOSIS — M4802 Spinal stenosis, cervical region: Secondary | ICD-10-CM | POA: Diagnosis not present

## 2022-09-18 DIAGNOSIS — Z6841 Body Mass Index (BMI) 40.0 and over, adult: Secondary | ICD-10-CM | POA: Diagnosis not present

## 2022-09-18 DIAGNOSIS — M25562 Pain in left knee: Secondary | ICD-10-CM | POA: Diagnosis not present

## 2022-09-18 DIAGNOSIS — M47812 Spondylosis without myelopathy or radiculopathy, cervical region: Secondary | ICD-10-CM | POA: Diagnosis not present

## 2022-09-18 DIAGNOSIS — M25512 Pain in left shoulder: Secondary | ICD-10-CM | POA: Diagnosis not present

## 2022-09-18 DIAGNOSIS — M542 Cervicalgia: Secondary | ICD-10-CM | POA: Diagnosis not present

## 2022-09-18 DIAGNOSIS — Z9889 Other specified postprocedural states: Secondary | ICD-10-CM | POA: Diagnosis not present

## 2022-09-18 DIAGNOSIS — G8929 Other chronic pain: Secondary | ICD-10-CM | POA: Diagnosis not present

## 2022-09-18 DIAGNOSIS — Z79899 Other long term (current) drug therapy: Secondary | ICD-10-CM | POA: Diagnosis not present

## 2022-09-25 DIAGNOSIS — H6503 Acute serous otitis media, bilateral: Secondary | ICD-10-CM | POA: Diagnosis not present

## 2022-09-25 DIAGNOSIS — Z20828 Contact with and (suspected) exposure to other viral communicable diseases: Secondary | ICD-10-CM | POA: Diagnosis not present

## 2022-09-25 DIAGNOSIS — J029 Acute pharyngitis, unspecified: Secondary | ICD-10-CM | POA: Diagnosis not present

## 2022-09-25 DIAGNOSIS — J040 Acute laryngitis: Secondary | ICD-10-CM | POA: Diagnosis not present

## 2022-10-15 DIAGNOSIS — M25512 Pain in left shoulder: Secondary | ICD-10-CM | POA: Diagnosis not present

## 2022-10-15 DIAGNOSIS — M4802 Spinal stenosis, cervical region: Secondary | ICD-10-CM | POA: Diagnosis not present

## 2022-10-15 DIAGNOSIS — H6690 Otitis media, unspecified, unspecified ear: Secondary | ICD-10-CM | POA: Diagnosis not present

## 2022-10-15 DIAGNOSIS — Z6841 Body Mass Index (BMI) 40.0 and over, adult: Secondary | ICD-10-CM | POA: Diagnosis not present

## 2022-10-15 DIAGNOSIS — Z79899 Other long term (current) drug therapy: Secondary | ICD-10-CM | POA: Diagnosis not present

## 2022-10-15 DIAGNOSIS — M47812 Spondylosis without myelopathy or radiculopathy, cervical region: Secondary | ICD-10-CM | POA: Diagnosis not present

## 2022-10-15 DIAGNOSIS — M25561 Pain in right knee: Secondary | ICD-10-CM | POA: Diagnosis not present

## 2022-10-15 DIAGNOSIS — R300 Dysuria: Secondary | ICD-10-CM | POA: Diagnosis not present

## 2022-10-15 DIAGNOSIS — G8929 Other chronic pain: Secondary | ICD-10-CM | POA: Diagnosis not present

## 2022-10-15 DIAGNOSIS — M542 Cervicalgia: Secondary | ICD-10-CM | POA: Diagnosis not present

## 2022-10-15 DIAGNOSIS — Z9889 Other specified postprocedural states: Secondary | ICD-10-CM | POA: Diagnosis not present

## 2022-11-23 DIAGNOSIS — H6690 Otitis media, unspecified, unspecified ear: Secondary | ICD-10-CM | POA: Diagnosis not present

## 2022-11-23 DIAGNOSIS — G8929 Other chronic pain: Secondary | ICD-10-CM | POA: Diagnosis not present

## 2022-11-23 DIAGNOSIS — M25562 Pain in left knee: Secondary | ICD-10-CM | POA: Diagnosis not present

## 2022-11-23 DIAGNOSIS — R892 Abnormal level of other drugs, medicaments and biological substances in specimens from other organs, systems and tissues: Secondary | ICD-10-CM | POA: Diagnosis not present

## 2022-11-23 DIAGNOSIS — Z6841 Body Mass Index (BMI) 40.0 and over, adult: Secondary | ICD-10-CM | POA: Diagnosis not present

## 2022-11-23 DIAGNOSIS — Z79899 Other long term (current) drug therapy: Secondary | ICD-10-CM | POA: Diagnosis not present

## 2022-11-23 DIAGNOSIS — M47812 Spondylosis without myelopathy or radiculopathy, cervical region: Secondary | ICD-10-CM | POA: Diagnosis not present

## 2022-11-23 DIAGNOSIS — F1320 Sedative, hypnotic or anxiolytic dependence, uncomplicated: Secondary | ICD-10-CM | POA: Diagnosis not present

## 2022-11-23 DIAGNOSIS — M25561 Pain in right knee: Secondary | ICD-10-CM | POA: Diagnosis not present

## 2022-11-23 DIAGNOSIS — M4802 Spinal stenosis, cervical region: Secondary | ICD-10-CM | POA: Diagnosis not present

## 2022-11-23 DIAGNOSIS — M25512 Pain in left shoulder: Secondary | ICD-10-CM | POA: Diagnosis not present

## 2022-11-23 DIAGNOSIS — M542 Cervicalgia: Secondary | ICD-10-CM | POA: Diagnosis not present

## 2022-12-11 DIAGNOSIS — Z79899 Other long term (current) drug therapy: Secondary | ICD-10-CM | POA: Diagnosis not present

## 2022-12-11 DIAGNOSIS — R892 Abnormal level of other drugs, medicaments and biological substances in specimens from other organs, systems and tissues: Secondary | ICD-10-CM | POA: Diagnosis not present

## 2022-12-11 DIAGNOSIS — G8929 Other chronic pain: Secondary | ICD-10-CM | POA: Diagnosis not present

## 2022-12-11 DIAGNOSIS — M542 Cervicalgia: Secondary | ICD-10-CM | POA: Diagnosis not present

## 2022-12-11 DIAGNOSIS — M545 Low back pain, unspecified: Secondary | ICD-10-CM | POA: Diagnosis not present

## 2022-12-17 DIAGNOSIS — H6993 Unspecified Eustachian tube disorder, bilateral: Secondary | ICD-10-CM | POA: Diagnosis not present

## 2022-12-17 DIAGNOSIS — H8112 Benign paroxysmal vertigo, left ear: Secondary | ICD-10-CM | POA: Diagnosis not present

## 2022-12-17 DIAGNOSIS — J342 Deviated nasal septum: Secondary | ICD-10-CM | POA: Diagnosis not present

## 2022-12-24 DIAGNOSIS — M47812 Spondylosis without myelopathy or radiculopathy, cervical region: Secondary | ICD-10-CM | POA: Diagnosis not present

## 2022-12-24 DIAGNOSIS — M25512 Pain in left shoulder: Secondary | ICD-10-CM | POA: Diagnosis not present

## 2022-12-24 DIAGNOSIS — Z79899 Other long term (current) drug therapy: Secondary | ICD-10-CM | POA: Diagnosis not present

## 2022-12-24 DIAGNOSIS — Z0000 Encounter for general adult medical examination without abnormal findings: Secondary | ICD-10-CM | POA: Diagnosis not present

## 2022-12-24 DIAGNOSIS — M25561 Pain in right knee: Secondary | ICD-10-CM | POA: Diagnosis not present

## 2022-12-24 DIAGNOSIS — M4802 Spinal stenosis, cervical region: Secondary | ICD-10-CM | POA: Diagnosis not present

## 2022-12-24 DIAGNOSIS — G8929 Other chronic pain: Secondary | ICD-10-CM | POA: Diagnosis not present

## 2022-12-24 DIAGNOSIS — G43909 Migraine, unspecified, not intractable, without status migrainosus: Secondary | ICD-10-CM | POA: Diagnosis not present

## 2022-12-24 DIAGNOSIS — M25562 Pain in left knee: Secondary | ICD-10-CM | POA: Diagnosis not present

## 2022-12-24 DIAGNOSIS — M542 Cervicalgia: Secondary | ICD-10-CM | POA: Diagnosis not present

## 2022-12-24 DIAGNOSIS — Z6841 Body Mass Index (BMI) 40.0 and over, adult: Secondary | ICD-10-CM | POA: Diagnosis not present

## 2023-01-04 DIAGNOSIS — M65341 Trigger finger, right ring finger: Secondary | ICD-10-CM | POA: Diagnosis not present

## 2023-01-04 DIAGNOSIS — M65331 Trigger finger, right middle finger: Secondary | ICD-10-CM | POA: Diagnosis not present

## 2023-01-22 DIAGNOSIS — M25561 Pain in right knee: Secondary | ICD-10-CM | POA: Diagnosis not present

## 2023-01-22 DIAGNOSIS — M47812 Spondylosis without myelopathy or radiculopathy, cervical region: Secondary | ICD-10-CM | POA: Diagnosis not present

## 2023-01-22 DIAGNOSIS — G8929 Other chronic pain: Secondary | ICD-10-CM | POA: Diagnosis not present

## 2023-01-22 DIAGNOSIS — Z79899 Other long term (current) drug therapy: Secondary | ICD-10-CM | POA: Diagnosis not present

## 2023-01-22 DIAGNOSIS — R69 Illness, unspecified: Secondary | ICD-10-CM | POA: Diagnosis not present

## 2023-01-22 DIAGNOSIS — R519 Headache, unspecified: Secondary | ICD-10-CM | POA: Diagnosis not present

## 2023-01-22 DIAGNOSIS — M4802 Spinal stenosis, cervical region: Secondary | ICD-10-CM | POA: Diagnosis not present

## 2023-01-22 DIAGNOSIS — M542 Cervicalgia: Secondary | ICD-10-CM | POA: Diagnosis not present

## 2023-01-22 DIAGNOSIS — M25512 Pain in left shoulder: Secondary | ICD-10-CM | POA: Diagnosis not present

## 2023-01-22 DIAGNOSIS — Z6841 Body Mass Index (BMI) 40.0 and over, adult: Secondary | ICD-10-CM | POA: Diagnosis not present

## 2023-01-22 DIAGNOSIS — M25562 Pain in left knee: Secondary | ICD-10-CM | POA: Diagnosis not present

## 2023-01-22 DIAGNOSIS — G43909 Migraine, unspecified, not intractable, without status migrainosus: Secondary | ICD-10-CM | POA: Diagnosis not present

## 2023-02-09 DIAGNOSIS — R69 Illness, unspecified: Secondary | ICD-10-CM | POA: Diagnosis not present

## 2023-02-09 DIAGNOSIS — E782 Mixed hyperlipidemia: Secondary | ICD-10-CM | POA: Diagnosis not present

## 2023-02-09 DIAGNOSIS — Z6841 Body Mass Index (BMI) 40.0 and over, adult: Secondary | ICD-10-CM | POA: Diagnosis not present

## 2023-02-22 DIAGNOSIS — R300 Dysuria: Secondary | ICD-10-CM | POA: Diagnosis not present

## 2023-02-22 DIAGNOSIS — M25561 Pain in right knee: Secondary | ICD-10-CM | POA: Diagnosis not present

## 2023-02-22 DIAGNOSIS — M4802 Spinal stenosis, cervical region: Secondary | ICD-10-CM | POA: Diagnosis not present

## 2023-02-22 DIAGNOSIS — G8929 Other chronic pain: Secondary | ICD-10-CM | POA: Diagnosis not present

## 2023-02-22 DIAGNOSIS — M47812 Spondylosis without myelopathy or radiculopathy, cervical region: Secondary | ICD-10-CM | POA: Diagnosis not present

## 2023-02-22 DIAGNOSIS — Z6841 Body Mass Index (BMI) 40.0 and over, adult: Secondary | ICD-10-CM | POA: Diagnosis not present

## 2023-02-22 DIAGNOSIS — M25512 Pain in left shoulder: Secondary | ICD-10-CM | POA: Diagnosis not present

## 2023-02-22 DIAGNOSIS — R519 Headache, unspecified: Secondary | ICD-10-CM | POA: Diagnosis not present

## 2023-02-22 DIAGNOSIS — M542 Cervicalgia: Secondary | ICD-10-CM | POA: Diagnosis not present

## 2023-02-22 DIAGNOSIS — F1320 Sedative, hypnotic or anxiolytic dependence, uncomplicated: Secondary | ICD-10-CM | POA: Diagnosis not present

## 2023-02-22 DIAGNOSIS — Z79899 Other long term (current) drug therapy: Secondary | ICD-10-CM | POA: Diagnosis not present

## 2023-02-22 DIAGNOSIS — M25562 Pain in left knee: Secondary | ICD-10-CM | POA: Diagnosis not present

## 2023-03-25 DIAGNOSIS — M25512 Pain in left shoulder: Secondary | ICD-10-CM | POA: Diagnosis not present

## 2023-03-25 DIAGNOSIS — F1320 Sedative, hypnotic or anxiolytic dependence, uncomplicated: Secondary | ICD-10-CM | POA: Diagnosis not present

## 2023-03-25 DIAGNOSIS — Z9889 Other specified postprocedural states: Secondary | ICD-10-CM | POA: Diagnosis not present

## 2023-03-25 DIAGNOSIS — M47812 Spondylosis without myelopathy or radiculopathy, cervical region: Secondary | ICD-10-CM | POA: Diagnosis not present

## 2023-03-25 DIAGNOSIS — Z79899 Other long term (current) drug therapy: Secondary | ICD-10-CM | POA: Diagnosis not present

## 2023-03-25 DIAGNOSIS — M542 Cervicalgia: Secondary | ICD-10-CM | POA: Diagnosis not present

## 2023-03-25 DIAGNOSIS — G8929 Other chronic pain: Secondary | ICD-10-CM | POA: Diagnosis not present

## 2023-03-25 DIAGNOSIS — M25561 Pain in right knee: Secondary | ICD-10-CM | POA: Diagnosis not present

## 2023-03-25 DIAGNOSIS — M25562 Pain in left knee: Secondary | ICD-10-CM | POA: Diagnosis not present

## 2023-03-25 DIAGNOSIS — M4802 Spinal stenosis, cervical region: Secondary | ICD-10-CM | POA: Diagnosis not present

## 2023-04-26 DIAGNOSIS — G8929 Other chronic pain: Secondary | ICD-10-CM | POA: Diagnosis not present

## 2023-04-26 DIAGNOSIS — F1320 Sedative, hypnotic or anxiolytic dependence, uncomplicated: Secondary | ICD-10-CM | POA: Diagnosis not present

## 2023-04-26 DIAGNOSIS — Z9889 Other specified postprocedural states: Secondary | ICD-10-CM | POA: Diagnosis not present

## 2023-04-26 DIAGNOSIS — M25562 Pain in left knee: Secondary | ICD-10-CM | POA: Diagnosis not present

## 2023-04-26 DIAGNOSIS — Z6841 Body Mass Index (BMI) 40.0 and over, adult: Secondary | ICD-10-CM | POA: Diagnosis not present

## 2023-04-26 DIAGNOSIS — Z79899 Other long term (current) drug therapy: Secondary | ICD-10-CM | POA: Diagnosis not present

## 2023-04-26 DIAGNOSIS — M4802 Spinal stenosis, cervical region: Secondary | ICD-10-CM | POA: Diagnosis not present

## 2023-04-26 DIAGNOSIS — M47812 Spondylosis without myelopathy or radiculopathy, cervical region: Secondary | ICD-10-CM | POA: Diagnosis not present

## 2023-04-26 DIAGNOSIS — M25512 Pain in left shoulder: Secondary | ICD-10-CM | POA: Diagnosis not present

## 2023-04-26 DIAGNOSIS — M542 Cervicalgia: Secondary | ICD-10-CM | POA: Diagnosis not present

## 2023-04-26 DIAGNOSIS — M25561 Pain in right knee: Secondary | ICD-10-CM | POA: Diagnosis not present

## 2023-04-28 DIAGNOSIS — Z79899 Other long term (current) drug therapy: Secondary | ICD-10-CM | POA: Diagnosis not present

## 2023-05-18 DIAGNOSIS — L03119 Cellulitis of unspecified part of limb: Secondary | ICD-10-CM | POA: Diagnosis not present

## 2023-05-18 DIAGNOSIS — J029 Acute pharyngitis, unspecified: Secondary | ICD-10-CM | POA: Diagnosis not present

## 2023-05-18 DIAGNOSIS — L03113 Cellulitis of right upper limb: Secondary | ICD-10-CM | POA: Diagnosis not present

## 2023-05-18 DIAGNOSIS — Z03818 Encounter for observation for suspected exposure to other biological agents ruled out: Secondary | ICD-10-CM | POA: Diagnosis not present

## 2023-06-03 DIAGNOSIS — M25512 Pain in left shoulder: Secondary | ICD-10-CM | POA: Diagnosis not present

## 2023-06-03 DIAGNOSIS — G8929 Other chronic pain: Secondary | ICD-10-CM | POA: Diagnosis not present

## 2023-06-03 DIAGNOSIS — Z79899 Other long term (current) drug therapy: Secondary | ICD-10-CM | POA: Diagnosis not present

## 2023-06-03 DIAGNOSIS — Z9889 Other specified postprocedural states: Secondary | ICD-10-CM | POA: Diagnosis not present

## 2023-06-03 DIAGNOSIS — M542 Cervicalgia: Secondary | ICD-10-CM | POA: Diagnosis not present

## 2023-06-03 DIAGNOSIS — Z6841 Body Mass Index (BMI) 40.0 and over, adult: Secondary | ICD-10-CM | POA: Diagnosis not present

## 2023-06-03 DIAGNOSIS — M25561 Pain in right knee: Secondary | ICD-10-CM | POA: Diagnosis not present

## 2023-06-03 DIAGNOSIS — F1320 Sedative, hypnotic or anxiolytic dependence, uncomplicated: Secondary | ICD-10-CM | POA: Diagnosis not present

## 2023-06-07 DIAGNOSIS — Z79899 Other long term (current) drug therapy: Secondary | ICD-10-CM | POA: Diagnosis not present

## 2023-07-08 DIAGNOSIS — M25512 Pain in left shoulder: Secondary | ICD-10-CM | POA: Diagnosis not present

## 2023-07-08 DIAGNOSIS — G8929 Other chronic pain: Secondary | ICD-10-CM | POA: Diagnosis not present

## 2023-07-08 DIAGNOSIS — Z79899 Other long term (current) drug therapy: Secondary | ICD-10-CM | POA: Diagnosis not present

## 2023-07-08 DIAGNOSIS — R300 Dysuria: Secondary | ICD-10-CM | POA: Diagnosis not present

## 2023-07-08 DIAGNOSIS — N390 Urinary tract infection, site not specified: Secondary | ICD-10-CM | POA: Diagnosis not present

## 2023-07-08 DIAGNOSIS — Z9889 Other specified postprocedural states: Secondary | ICD-10-CM | POA: Diagnosis not present

## 2023-07-08 DIAGNOSIS — F1320 Sedative, hypnotic or anxiolytic dependence, uncomplicated: Secondary | ICD-10-CM | POA: Diagnosis not present

## 2023-07-08 DIAGNOSIS — Z6841 Body Mass Index (BMI) 40.0 and over, adult: Secondary | ICD-10-CM | POA: Diagnosis not present

## 2023-07-08 DIAGNOSIS — M25561 Pain in right knee: Secondary | ICD-10-CM | POA: Diagnosis not present

## 2023-07-08 DIAGNOSIS — M542 Cervicalgia: Secondary | ICD-10-CM | POA: Diagnosis not present

## 2023-07-23 DIAGNOSIS — M25511 Pain in right shoulder: Secondary | ICD-10-CM | POA: Diagnosis not present

## 2023-08-05 DIAGNOSIS — E782 Mixed hyperlipidemia: Secondary | ICD-10-CM | POA: Diagnosis not present

## 2023-08-05 DIAGNOSIS — Z79899 Other long term (current) drug therapy: Secondary | ICD-10-CM | POA: Diagnosis not present

## 2023-08-05 DIAGNOSIS — E559 Vitamin D deficiency, unspecified: Secondary | ICD-10-CM | POA: Diagnosis not present

## 2023-08-09 DIAGNOSIS — Z79899 Other long term (current) drug therapy: Secondary | ICD-10-CM | POA: Diagnosis not present

## 2023-08-11 DIAGNOSIS — M25511 Pain in right shoulder: Secondary | ICD-10-CM | POA: Diagnosis not present

## 2023-09-05 DIAGNOSIS — Z6839 Body mass index (BMI) 39.0-39.9, adult: Secondary | ICD-10-CM | POA: Diagnosis not present

## 2023-09-05 DIAGNOSIS — M25561 Pain in right knee: Secondary | ICD-10-CM | POA: Diagnosis not present

## 2023-09-05 DIAGNOSIS — Z9889 Other specified postprocedural states: Secondary | ICD-10-CM | POA: Diagnosis not present

## 2023-09-05 DIAGNOSIS — E559 Vitamin D deficiency, unspecified: Secondary | ICD-10-CM | POA: Diagnosis not present

## 2023-09-05 DIAGNOSIS — Z79899 Other long term (current) drug therapy: Secondary | ICD-10-CM | POA: Diagnosis not present

## 2023-09-05 DIAGNOSIS — G8929 Other chronic pain: Secondary | ICD-10-CM | POA: Diagnosis not present

## 2023-09-05 DIAGNOSIS — R739 Hyperglycemia, unspecified: Secondary | ICD-10-CM | POA: Diagnosis not present

## 2023-09-05 DIAGNOSIS — M542 Cervicalgia: Secondary | ICD-10-CM | POA: Diagnosis not present

## 2023-09-06 DIAGNOSIS — M25511 Pain in right shoulder: Secondary | ICD-10-CM | POA: Diagnosis not present

## 2023-09-09 DIAGNOSIS — Z79899 Other long term (current) drug therapy: Secondary | ICD-10-CM | POA: Diagnosis not present

## 2023-09-22 ENCOUNTER — Other Ambulatory Visit: Payer: Self-pay | Admitting: Orthopedic Surgery

## 2023-09-22 DIAGNOSIS — N390 Urinary tract infection, site not specified: Secondary | ICD-10-CM | POA: Diagnosis not present

## 2023-09-22 DIAGNOSIS — M25511 Pain in right shoulder: Secondary | ICD-10-CM

## 2023-09-23 DIAGNOSIS — R748 Abnormal levels of other serum enzymes: Secondary | ICD-10-CM | POA: Diagnosis not present

## 2023-10-12 DIAGNOSIS — Z79899 Other long term (current) drug therapy: Secondary | ICD-10-CM | POA: Diagnosis not present

## 2023-10-12 DIAGNOSIS — M542 Cervicalgia: Secondary | ICD-10-CM | POA: Diagnosis not present

## 2023-10-12 DIAGNOSIS — Z6837 Body mass index (BMI) 37.0-37.9, adult: Secondary | ICD-10-CM | POA: Diagnosis not present

## 2023-10-12 DIAGNOSIS — Z9889 Other specified postprocedural states: Secondary | ICD-10-CM | POA: Diagnosis not present

## 2023-10-12 DIAGNOSIS — E6609 Other obesity due to excess calories: Secondary | ICD-10-CM | POA: Diagnosis not present

## 2023-10-12 DIAGNOSIS — E559 Vitamin D deficiency, unspecified: Secondary | ICD-10-CM | POA: Diagnosis not present

## 2023-10-13 ENCOUNTER — Encounter: Payer: Self-pay | Admitting: Orthopedic Surgery

## 2023-10-15 DIAGNOSIS — Z79899 Other long term (current) drug therapy: Secondary | ICD-10-CM | POA: Diagnosis not present

## 2023-10-19 ENCOUNTER — Other Ambulatory Visit: Payer: Medicare HMO

## 2023-12-03 DIAGNOSIS — Z79899 Other long term (current) drug therapy: Secondary | ICD-10-CM | POA: Diagnosis not present

## 2024-01-17 DIAGNOSIS — Z6837 Body mass index (BMI) 37.0-37.9, adult: Secondary | ICD-10-CM | POA: Diagnosis not present

## 2024-01-17 DIAGNOSIS — M542 Cervicalgia: Secondary | ICD-10-CM | POA: Diagnosis not present

## 2024-01-17 DIAGNOSIS — Z9889 Other specified postprocedural states: Secondary | ICD-10-CM | POA: Diagnosis not present

## 2024-01-17 DIAGNOSIS — F419 Anxiety disorder, unspecified: Secondary | ICD-10-CM | POA: Diagnosis not present

## 2024-01-17 DIAGNOSIS — G8929 Other chronic pain: Secondary | ICD-10-CM | POA: Diagnosis not present

## 2024-01-17 DIAGNOSIS — M25561 Pain in right knee: Secondary | ICD-10-CM | POA: Diagnosis not present

## 2024-01-17 DIAGNOSIS — Z79899 Other long term (current) drug therapy: Secondary | ICD-10-CM | POA: Diagnosis not present

## 2024-02-23 DIAGNOSIS — Z79899 Other long term (current) drug therapy: Secondary | ICD-10-CM | POA: Diagnosis not present

## 2024-02-23 DIAGNOSIS — Z1231 Encounter for screening mammogram for malignant neoplasm of breast: Secondary | ICD-10-CM | POA: Diagnosis not present

## 2024-02-23 DIAGNOSIS — Z6837 Body mass index (BMI) 37.0-37.9, adult: Secondary | ICD-10-CM | POA: Diagnosis not present

## 2024-02-23 DIAGNOSIS — M25562 Pain in left knee: Secondary | ICD-10-CM | POA: Diagnosis not present

## 2024-02-23 DIAGNOSIS — F419 Anxiety disorder, unspecified: Secondary | ICD-10-CM | POA: Diagnosis not present

## 2024-02-23 DIAGNOSIS — M47812 Spondylosis without myelopathy or radiculopathy, cervical region: Secondary | ICD-10-CM | POA: Diagnosis not present

## 2024-02-23 DIAGNOSIS — R0602 Shortness of breath: Secondary | ICD-10-CM | POA: Diagnosis not present

## 2024-02-23 DIAGNOSIS — G8929 Other chronic pain: Secondary | ICD-10-CM | POA: Diagnosis not present

## 2024-02-24 DIAGNOSIS — K219 Gastro-esophageal reflux disease without esophagitis: Secondary | ICD-10-CM | POA: Diagnosis not present

## 2024-02-24 DIAGNOSIS — Z658 Other specified problems related to psychosocial circumstances: Secondary | ICD-10-CM | POA: Diagnosis not present

## 2024-02-24 DIAGNOSIS — Z6838 Body mass index (BMI) 38.0-38.9, adult: Secondary | ICD-10-CM | POA: Diagnosis not present

## 2024-02-24 DIAGNOSIS — E782 Mixed hyperlipidemia: Secondary | ICD-10-CM | POA: Diagnosis not present

## 2024-02-24 DIAGNOSIS — F410 Panic disorder [episodic paroxysmal anxiety] without agoraphobia: Secondary | ICD-10-CM | POA: Diagnosis not present

## 2024-02-24 DIAGNOSIS — F4310 Post-traumatic stress disorder, unspecified: Secondary | ICD-10-CM | POA: Diagnosis not present

## 2024-03-13 DIAGNOSIS — Z1231 Encounter for screening mammogram for malignant neoplasm of breast: Secondary | ICD-10-CM | POA: Diagnosis not present

## 2024-04-05 DIAGNOSIS — N644 Mastodynia: Secondary | ICD-10-CM | POA: Diagnosis not present

## 2024-04-05 DIAGNOSIS — E559 Vitamin D deficiency, unspecified: Secondary | ICD-10-CM | POA: Diagnosis not present

## 2024-04-05 DIAGNOSIS — Z131 Encounter for screening for diabetes mellitus: Secondary | ICD-10-CM | POA: Diagnosis not present

## 2024-04-05 DIAGNOSIS — M47812 Spondylosis without myelopathy or radiculopathy, cervical region: Secondary | ICD-10-CM | POA: Diagnosis not present

## 2024-04-05 DIAGNOSIS — M25562 Pain in left knee: Secondary | ICD-10-CM | POA: Diagnosis not present

## 2024-04-05 DIAGNOSIS — E66812 Obesity, class 2: Secondary | ICD-10-CM | POA: Diagnosis not present

## 2024-04-05 DIAGNOSIS — Z79899 Other long term (current) drug therapy: Secondary | ICD-10-CM | POA: Diagnosis not present

## 2024-04-05 DIAGNOSIS — R928 Other abnormal and inconclusive findings on diagnostic imaging of breast: Secondary | ICD-10-CM | POA: Diagnosis not present

## 2024-04-05 DIAGNOSIS — G8929 Other chronic pain: Secondary | ICD-10-CM | POA: Diagnosis not present

## 2024-04-05 DIAGNOSIS — M129 Arthropathy, unspecified: Secondary | ICD-10-CM | POA: Diagnosis not present

## 2024-04-05 DIAGNOSIS — Z6835 Body mass index (BMI) 35.0-35.9, adult: Secondary | ICD-10-CM | POA: Diagnosis not present

## 2024-04-07 DIAGNOSIS — Z79899 Other long term (current) drug therapy: Secondary | ICD-10-CM | POA: Diagnosis not present

## 2024-05-14 DIAGNOSIS — M25562 Pain in left knee: Secondary | ICD-10-CM | POA: Diagnosis not present

## 2024-05-14 DIAGNOSIS — M47812 Spondylosis without myelopathy or radiculopathy, cervical region: Secondary | ICD-10-CM | POA: Diagnosis not present

## 2024-05-14 DIAGNOSIS — Z79899 Other long term (current) drug therapy: Secondary | ICD-10-CM | POA: Diagnosis not present

## 2024-05-14 DIAGNOSIS — G8929 Other chronic pain: Secondary | ICD-10-CM | POA: Diagnosis not present

## 2024-05-14 DIAGNOSIS — Z6835 Body mass index (BMI) 35.0-35.9, adult: Secondary | ICD-10-CM | POA: Diagnosis not present

## 2024-05-17 DIAGNOSIS — M25532 Pain in left wrist: Secondary | ICD-10-CM | POA: Diagnosis not present

## 2024-05-17 DIAGNOSIS — S63502A Unspecified sprain of left wrist, initial encounter: Secondary | ICD-10-CM | POA: Diagnosis not present

## 2024-06-02 DIAGNOSIS — N644 Mastodynia: Secondary | ICD-10-CM | POA: Diagnosis not present

## 2024-07-04 DIAGNOSIS — Z79899 Other long term (current) drug therapy: Secondary | ICD-10-CM | POA: Diagnosis not present

## 2024-07-04 DIAGNOSIS — M47812 Spondylosis without myelopathy or radiculopathy, cervical region: Secondary | ICD-10-CM | POA: Diagnosis not present

## 2024-07-04 DIAGNOSIS — Z6835 Body mass index (BMI) 35.0-35.9, adult: Secondary | ICD-10-CM | POA: Diagnosis not present

## 2024-07-04 DIAGNOSIS — R0602 Shortness of breath: Secondary | ICD-10-CM | POA: Diagnosis not present

## 2024-07-04 DIAGNOSIS — M25562 Pain in left knee: Secondary | ICD-10-CM | POA: Diagnosis not present

## 2024-07-04 DIAGNOSIS — G8929 Other chronic pain: Secondary | ICD-10-CM | POA: Diagnosis not present

## 2024-07-06 DIAGNOSIS — Z79899 Other long term (current) drug therapy: Secondary | ICD-10-CM | POA: Diagnosis not present

## 2024-08-03 DIAGNOSIS — N390 Urinary tract infection, site not specified: Secondary | ICD-10-CM | POA: Diagnosis not present

## 2024-08-15 DIAGNOSIS — Z6836 Body mass index (BMI) 36.0-36.9, adult: Secondary | ICD-10-CM | POA: Diagnosis not present

## 2024-08-15 DIAGNOSIS — M25562 Pain in left knee: Secondary | ICD-10-CM | POA: Diagnosis not present

## 2024-08-15 DIAGNOSIS — F4310 Post-traumatic stress disorder, unspecified: Secondary | ICD-10-CM | POA: Diagnosis not present

## 2024-08-15 DIAGNOSIS — Z6834 Body mass index (BMI) 34.0-34.9, adult: Secondary | ICD-10-CM | POA: Diagnosis not present

## 2024-08-15 DIAGNOSIS — K219 Gastro-esophageal reflux disease without esophagitis: Secondary | ICD-10-CM | POA: Diagnosis not present

## 2024-08-15 DIAGNOSIS — Z79899 Other long term (current) drug therapy: Secondary | ICD-10-CM | POA: Diagnosis not present

## 2024-08-15 DIAGNOSIS — Z23 Encounter for immunization: Secondary | ICD-10-CM | POA: Diagnosis not present

## 2024-08-15 DIAGNOSIS — Z0000 Encounter for general adult medical examination without abnormal findings: Secondary | ICD-10-CM | POA: Diagnosis not present

## 2024-08-15 DIAGNOSIS — E782 Mixed hyperlipidemia: Secondary | ICD-10-CM | POA: Diagnosis not present

## 2024-08-15 DIAGNOSIS — F410 Panic disorder [episodic paroxysmal anxiety] without agoraphobia: Secondary | ICD-10-CM | POA: Diagnosis not present

## 2024-08-15 DIAGNOSIS — N390 Urinary tract infection, site not specified: Secondary | ICD-10-CM | POA: Diagnosis not present

## 2024-08-15 DIAGNOSIS — E559 Vitamin D deficiency, unspecified: Secondary | ICD-10-CM | POA: Diagnosis not present

## 2024-08-15 DIAGNOSIS — E876 Hypokalemia: Secondary | ICD-10-CM | POA: Diagnosis not present

## 2024-08-15 DIAGNOSIS — M47812 Spondylosis without myelopathy or radiculopathy, cervical region: Secondary | ICD-10-CM | POA: Diagnosis not present

## 2024-08-15 DIAGNOSIS — J309 Allergic rhinitis, unspecified: Secondary | ICD-10-CM | POA: Diagnosis not present

## 2024-08-15 DIAGNOSIS — G8929 Other chronic pain: Secondary | ICD-10-CM | POA: Diagnosis not present

## 2024-08-18 DIAGNOSIS — Z79899 Other long term (current) drug therapy: Secondary | ICD-10-CM | POA: Diagnosis not present

## 2024-09-18 ENCOUNTER — Encounter: Payer: Self-pay | Admitting: Orthopedic Surgery

## 2024-09-18 DIAGNOSIS — Z6835 Body mass index (BMI) 35.0-35.9, adult: Secondary | ICD-10-CM | POA: Diagnosis not present

## 2024-09-18 DIAGNOSIS — M25562 Pain in left knee: Secondary | ICD-10-CM | POA: Diagnosis not present

## 2024-09-18 DIAGNOSIS — M47812 Spondylosis without myelopathy or radiculopathy, cervical region: Secondary | ICD-10-CM | POA: Diagnosis not present

## 2024-09-18 DIAGNOSIS — Z79899 Other long term (current) drug therapy: Secondary | ICD-10-CM | POA: Diagnosis not present

## 2024-09-18 DIAGNOSIS — G43909 Migraine, unspecified, not intractable, without status migrainosus: Secondary | ICD-10-CM | POA: Diagnosis not present

## 2024-09-20 DIAGNOSIS — Z79899 Other long term (current) drug therapy: Secondary | ICD-10-CM | POA: Diagnosis not present
